# Patient Record
Sex: Female | Born: 1935 | Race: White | Hispanic: No | Marital: Married | Smoking: Never smoker
Health system: Southern US, Community
[De-identification: ages and names within clinical notes are randomized; demographics above are authoritative.]

## PROBLEM LIST (undated history)

## (undated) DIAGNOSIS — I10 Essential (primary) hypertension: Secondary | ICD-10-CM

## (undated) DIAGNOSIS — R1900 Intra-abdominal and pelvic swelling, mass and lump, unspecified site: Secondary | ICD-10-CM

## (undated) DIAGNOSIS — N179 Acute kidney failure, unspecified: Secondary | ICD-10-CM

## (undated) DIAGNOSIS — E785 Hyperlipidemia, unspecified: Secondary | ICD-10-CM

## (undated) HISTORY — PX: NO PAST SURGERIES: SHX2092

---

## 2017-11-01 DIAGNOSIS — R1900 Intra-abdominal and pelvic swelling, mass and lump, unspecified site: Secondary | ICD-10-CM

## 2017-11-01 DIAGNOSIS — N179 Acute kidney failure, unspecified: Secondary | ICD-10-CM

## 2017-11-01 HISTORY — DX: Intra-abdominal and pelvic swelling, mass and lump, unspecified site: R19.00

## 2017-11-01 HISTORY — DX: Acute kidney failure, unspecified: N17.9

## 2017-11-18 ENCOUNTER — Encounter (HOSPITAL_COMMUNITY): Payer: Self-pay | Admitting: *Deleted

## 2017-11-18 ENCOUNTER — Inpatient Hospital Stay (HOSPITAL_COMMUNITY): Payer: Medicare Other

## 2017-11-18 ENCOUNTER — Inpatient Hospital Stay (HOSPITAL_COMMUNITY)
Admission: AD | Admit: 2017-11-18 | Discharge: 2017-12-02 | DRG: 682 | Disposition: E | Payer: Medicare Other | Source: Other Acute Inpatient Hospital | Attending: Internal Medicine | Admitting: Internal Medicine

## 2017-11-18 ENCOUNTER — Other Ambulatory Visit: Payer: Self-pay

## 2017-11-18 DIAGNOSIS — D638 Anemia in other chronic diseases classified elsewhere: Secondary | ICD-10-CM | POA: Diagnosis present

## 2017-11-18 DIAGNOSIS — K5900 Constipation, unspecified: Secondary | ICD-10-CM | POA: Diagnosis present

## 2017-11-18 DIAGNOSIS — R339 Retention of urine, unspecified: Secondary | ICD-10-CM | POA: Diagnosis not present

## 2017-11-18 DIAGNOSIS — R571 Hypovolemic shock: Secondary | ICD-10-CM | POA: Diagnosis present

## 2017-11-18 DIAGNOSIS — R609 Edema, unspecified: Secondary | ICD-10-CM

## 2017-11-18 DIAGNOSIS — E785 Hyperlipidemia, unspecified: Secondary | ICD-10-CM | POA: Diagnosis present

## 2017-11-18 DIAGNOSIS — C786 Secondary malignant neoplasm of retroperitoneum and peritoneum: Secondary | ICD-10-CM | POA: Diagnosis present

## 2017-11-18 DIAGNOSIS — R739 Hyperglycemia, unspecified: Secondary | ICD-10-CM | POA: Diagnosis present

## 2017-11-18 DIAGNOSIS — R0600 Dyspnea, unspecified: Secondary | ICD-10-CM

## 2017-11-18 DIAGNOSIS — R1310 Dysphagia, unspecified: Secondary | ICD-10-CM | POA: Diagnosis not present

## 2017-11-18 DIAGNOSIS — E871 Hypo-osmolality and hyponatremia: Secondary | ICD-10-CM | POA: Diagnosis present

## 2017-11-18 DIAGNOSIS — N179 Acute kidney failure, unspecified: Secondary | ICD-10-CM | POA: Diagnosis present

## 2017-11-18 DIAGNOSIS — T445X5A Adverse effect of predominantly beta-adrenoreceptor agonists, initial encounter: Secondary | ICD-10-CM | POA: Diagnosis present

## 2017-11-18 DIAGNOSIS — N141 Nephropathy induced by other drugs, medicaments and biological substances: Secondary | ICD-10-CM | POA: Diagnosis present

## 2017-11-18 DIAGNOSIS — E440 Moderate protein-calorie malnutrition: Secondary | ICD-10-CM | POA: Diagnosis present

## 2017-11-18 DIAGNOSIS — I10 Essential (primary) hypertension: Secondary | ICD-10-CM | POA: Diagnosis present

## 2017-11-18 DIAGNOSIS — Z66 Do not resuscitate: Secondary | ICD-10-CM | POA: Diagnosis present

## 2017-11-18 DIAGNOSIS — T39395A Adverse effect of other nonsteroidal anti-inflammatory drugs [NSAID], initial encounter: Secondary | ICD-10-CM | POA: Diagnosis present

## 2017-11-18 DIAGNOSIS — E875 Hyperkalemia: Secondary | ICD-10-CM | POA: Diagnosis present

## 2017-11-18 DIAGNOSIS — R180 Malignant ascites: Secondary | ICD-10-CM | POA: Diagnosis present

## 2017-11-18 DIAGNOSIS — Z515 Encounter for palliative care: Secondary | ICD-10-CM | POA: Diagnosis present

## 2017-11-18 DIAGNOSIS — E872 Acidosis: Secondary | ICD-10-CM | POA: Diagnosis present

## 2017-11-18 DIAGNOSIS — C569 Malignant neoplasm of unspecified ovary: Secondary | ICD-10-CM | POA: Diagnosis present

## 2017-11-18 DIAGNOSIS — R188 Other ascites: Secondary | ICD-10-CM

## 2017-11-18 DIAGNOSIS — I361 Nonrheumatic tricuspid (valve) insufficiency: Secondary | ICD-10-CM | POA: Diagnosis not present

## 2017-11-18 DIAGNOSIS — E8770 Fluid overload, unspecified: Secondary | ICD-10-CM | POA: Diagnosis not present

## 2017-11-18 DIAGNOSIS — J9601 Acute respiratory failure with hypoxia: Secondary | ICD-10-CM | POA: Diagnosis present

## 2017-11-18 DIAGNOSIS — I878 Other specified disorders of veins: Secondary | ICD-10-CM

## 2017-11-18 DIAGNOSIS — N170 Acute kidney failure with tubular necrosis: Secondary | ICD-10-CM | POA: Diagnosis not present

## 2017-11-18 DIAGNOSIS — R1900 Intra-abdominal and pelvic swelling, mass and lump, unspecified site: Secondary | ICD-10-CM

## 2017-11-18 DIAGNOSIS — R579 Shock, unspecified: Secondary | ICD-10-CM | POA: Diagnosis not present

## 2017-11-18 HISTORY — DX: Intra-abdominal and pelvic swelling, mass and lump, unspecified site: R19.00

## 2017-11-18 HISTORY — DX: Acute kidney failure, unspecified: N17.9

## 2017-11-18 HISTORY — DX: Essential (primary) hypertension: I10

## 2017-11-18 HISTORY — DX: Hyperlipidemia, unspecified: E78.5

## 2017-11-18 LAB — BASIC METABOLIC PANEL
Anion gap: 14 (ref 5–15)
BUN: 47 mg/dL — ABNORMAL HIGH (ref 8–23)
CO2: 17 mmol/L — ABNORMAL LOW (ref 22–32)
Calcium: 10.5 mg/dL — ABNORMAL HIGH (ref 8.9–10.3)
Chloride: 95 mmol/L — ABNORMAL LOW (ref 98–111)
Creatinine, Ser: 4.49 mg/dL — ABNORMAL HIGH (ref 0.44–1.00)
GFR calc Af Amer: 9 mL/min — ABNORMAL LOW (ref >60)
GFR calc non Af Amer: 8 mL/min — ABNORMAL LOW (ref >60)
Glucose, Bld: 116 mg/dL — ABNORMAL HIGH (ref 70–99)
Potassium: 5.3 mmol/L — ABNORMAL HIGH (ref 3.5–5.1)
Sodium: 126 mmol/L — ABNORMAL LOW (ref 135–145)

## 2017-11-18 LAB — COMPREHENSIVE METABOLIC PANEL
ALBUMIN: 2.6 g/dL — AB (ref 3.5–5.0)
ALK PHOS: 94 U/L (ref 38–126)
ALT: 10 U/L (ref 0–44)
AST: 14 U/L — AB (ref 15–41)
Anion gap: 15 (ref 5–15)
BILIRUBIN TOTAL: 0.8 mg/dL (ref 0.3–1.2)
BUN: 47 mg/dL — AB (ref 8–23)
CALCIUM: 10.5 mg/dL — AB (ref 8.9–10.3)
CO2: 15 mmol/L — AB (ref 22–32)
Chloride: 95 mmol/L — ABNORMAL LOW (ref 98–111)
Creatinine, Ser: 4.47 mg/dL — ABNORMAL HIGH (ref 0.44–1.00)
GFR calc Af Amer: 10 mL/min — ABNORMAL LOW (ref >60)
GFR calc non Af Amer: 8 mL/min — ABNORMAL LOW (ref >60)
Glucose, Bld: 97 mg/dL (ref 70–99)
Potassium: 5.1 mmol/L (ref 3.5–5.1)
SODIUM: 125 mmol/L — AB (ref 135–145)
TOTAL PROTEIN: 6.1 g/dL — AB (ref 6.5–8.1)

## 2017-11-18 LAB — URINALYSIS, ROUTINE W REFLEX MICROSCOPIC
BILIRUBIN URINE: NEGATIVE
Bacteria, UA: NONE SEEN
Glucose, UA: NEGATIVE mg/dL
KETONES UR: NEGATIVE mg/dL
Nitrite: NEGATIVE
PROTEIN: 100 mg/dL — AB
RBC / HPF: >50 RBC/hpf — ABNORMAL HIGH (ref 0–5)
Specific Gravity, Urine: 1.024 (ref 1.005–1.030)
WBC, UA: >50 WBC/hpf — ABNORMAL HIGH (ref 0–5)
pH: 5 (ref 5.0–8.0)

## 2017-11-18 LAB — CBC WITH DIFFERENTIAL/PLATELET
BASOS ABS: 0 10*3/uL (ref 0.0–0.1)
BASOS PCT: 0 %
EOS ABS: 0 10*3/uL (ref 0.0–0.7)
Eosinophils Relative: 0 %
HCT: 32.4 % — ABNORMAL LOW (ref 36.0–46.0)
HEMOGLOBIN: 10.8 g/dL — AB (ref 12.0–15.0)
LYMPHS ABS: 1.3 10*3/uL (ref 0.7–4.0)
LYMPHS PCT: 4 %
MCH: 31 pg (ref 26.0–34.0)
MCHC: 33.3 g/dL (ref 30.0–36.0)
MCV: 93.1 fL (ref 78.0–100.0)
MONOS PCT: 5 %
Monocytes Absolute: 1.6 10*3/uL — ABNORMAL HIGH (ref 0.1–1.0)
Neutro Abs: 28.9 10*3/uL — ABNORMAL HIGH (ref 1.7–7.7)
Neutrophils Relative %: 91 %
PLATELETS: 445 10*3/uL — AB (ref 150–400)
RBC: 3.48 MIL/uL — ABNORMAL LOW (ref 3.87–5.11)
RDW: 12.8 % (ref 11.5–15.5)
WBC: 31.8 10*3/uL — ABNORMAL HIGH (ref 4.0–10.5)

## 2017-11-18 LAB — GLUCOSE, CAPILLARY
GLUCOSE-CAPILLARY: 109 mg/dL — AB (ref 70–99)
Glucose-Capillary: 127 mg/dL — ABNORMAL HIGH (ref 70–99)
Glucose-Capillary: 103 mg/dL — ABNORMAL HIGH (ref 70–99)

## 2017-11-18 LAB — LACTIC ACID, PLASMA
LACTIC ACID, VENOUS: 2.4 mmol/L — AB (ref 0.5–1.9)
Lactic Acid, Venous: 2.4 mmol/L (ref 0.5–1.9)

## 2017-11-18 LAB — MAGNESIUM: MAGNESIUM: 1.7 mg/dL (ref 1.7–2.4)

## 2017-11-18 LAB — PROCALCITONIN: Procalcitonin: 0.85 ng/mL

## 2017-11-18 LAB — MRSA PCR SCREENING: MRSA by PCR: NEGATIVE

## 2017-11-18 MED ORDER — SODIUM CHLORIDE 0.9 % IV BOLUS
1000.0000 mL | Freq: Once | INTRAVENOUS | Status: AC
  Administered 2017-11-18: 1000 mL via INTRAVENOUS

## 2017-11-18 MED ORDER — ONDANSETRON HCL 4 MG/2ML IJ SOLN
4.0000 mg | Freq: Four times a day (QID) | INTRAMUSCULAR | Status: DC | PRN
  Administered 2017-11-20: 4 mg via INTRAVENOUS
  Filled 2017-11-18: qty 2

## 2017-11-18 MED ORDER — ORAL CARE MOUTH RINSE
15.0000 mL | Freq: Two times a day (BID) | OROMUCOSAL | Status: DC
  Administered 2017-11-18 – 2017-11-23 (×8): 15 mL via OROMUCOSAL

## 2017-11-18 MED ORDER — INSULIN ASPART 100 UNIT/ML ~~LOC~~ SOLN
1.0000 [IU] | SUBCUTANEOUS | Status: DC
  Administered 2017-11-19: 1 [IU] via SUBCUTANEOUS

## 2017-11-18 MED ORDER — PNEUMOCOCCAL VAC POLYVALENT 25 MCG/0.5ML IJ INJ
0.5000 mL | INJECTION | INTRAMUSCULAR | Status: DC
  Filled 2017-11-18: qty 0.5

## 2017-11-18 MED ORDER — SODIUM CHLORIDE 0.9 % IV SOLN
INTRAVENOUS | Status: DC
  Administered 2017-11-18 – 2017-11-19 (×3): via INTRAVENOUS
  Administered 2017-11-19: 150 mL/h via INTRAVENOUS
  Administered 2017-11-19: 14:00:00 via INTRAVENOUS

## 2017-11-18 MED ORDER — ACETAMINOPHEN 325 MG PO TABS
650.0000 mg | ORAL_TABLET | ORAL | Status: DC | PRN
  Administered 2017-11-18: 650 mg via ORAL
  Filled 2017-11-18: qty 2

## 2017-11-18 MED ORDER — SODIUM CHLORIDE 0.9 % IV BOLUS
500.0000 mL | Freq: Once | INTRAVENOUS | Status: AC
  Administered 2017-11-18: 500 mL via INTRAVENOUS

## 2017-11-18 MED ORDER — SODIUM CHLORIDE 0.9 % IV SOLN: 250.0000 mL | INTRAVENOUS | Status: DC | PRN

## 2017-11-18 MED ORDER — HEPARIN SODIUM (PORCINE) 5000 UNIT/ML IJ SOLN
5000.0000 [IU] | Freq: Three times a day (TID) | INTRAMUSCULAR | Status: AC
  Administered 2017-11-18 – 2017-11-21 (×10): 5000 [IU] via SUBCUTANEOUS
  Filled 2017-11-18 (×8): qty 1

## 2017-11-18 MED ORDER — SODIUM POLYSTYRENE SULFONATE 15 GM/60ML PO SUSP
30.0000 g | Freq: Once | ORAL | Status: AC
  Administered 2017-11-18: 30 g via ORAL
  Filled 2017-11-18: qty 120

## 2017-11-18 NOTE — Progress Notes (Signed)
CRITICAL VALUE ALERT  Critical Value:  Lactic acid 2.4  Date & Time Notied:  11/16/2017 @ 2050  Provider Notified: Lake Bells, D.  Orders Received/Actions taken: 500cc NS bolus, q4hr bmet

## 2017-11-18 NOTE — Progress Notes (Signed)
CRITICAL VALUE ALERT  Critical Value:  Lactic Acid 2.4  Date & Time Notied:  11/17/2017 1755  Provider Notified: Dr. Veleta Miners  Orders Received/Actions taken: No new orders

## 2017-11-18 NOTE — Progress Notes (Signed)
PCCM INTERVAL PROGRESS NOTE   Admission labs back.  BMP Latest Ref Rng & Units 11/22/2017  Glucose 70 - 99 mg/dL 97  BUN 8 - 23 mg/dL 47(H)  Creatinine 0.44 - 1.00 mg/dL 4.47(H)  Sodium 135 - 145 mmol/L 125(L)  Potassium 3.5 - 5.1 mmol/L 5.1  Chloride 98 - 111 mmol/L 95(L)  CO2 22 - 32 mmol/L 15(L)  Calcium 8.9 - 10.3 mg/dL 10.5(H)   CBC Latest Ref Rng & Units 11/30/2017  WBC 4.0 - 10.5 K/uL 31.8(H)  Hemoglobin 12.0 - 15.0 g/dL 10.8(L)  Hematocrit 36.0 - 46.0 % 32.4(L)  Platelets 150 - 400 K/uL 445(H)   Mag 1.7  Lactic acid 2.4  Hyponatremia -Increase NS infusion rate to 125 -Repeat BMP every 2 hours tonight to monitor sodium correction.  AKI: Likely due to hypovolemia and medications (ARB, HCTZ, NSAIDS)  -nephrology following  Large pelvic mass on CT - will consult GYN/Surgery in the AM.   Georgann Housekeeper, AGACNP-BC [facility] Pulmonology/Critical Care Pager [phone] or [phone]  11/17/2017 6:49 PM        Georgann Housekeeper, AGACNP-BC [place] Pulmonology/Critical Care Pager [phone] or [phone]  12/01/2017 6:42 PM

## 2017-11-18 NOTE — H&P (Addendum)
PULMONARY / CRITICAL CARE MEDICINE   Name: Diane Martin MRN: [id number] DOB: [birth year]    ADMISSION DATE:  11/29/2017 CONSULTATION DATE:  11/22/2017  REFERRING MD:  [facility]  CHIEF COMPLAINT:  Abdominal pain  HISTORY OF PRESENT ILLNESS:   Mrs. Diane Martin is a 84 y.o. Female with a PMH of HTN & hyperlipidemia who is transferred to [facility] on 11/13/2017.  She initially presented to her PCP approximately 4 days ago with c/o abdominal pain in which a CT Abdomen was ordered showing Pelvic mass.  She then presented to [facility] ED on 7/17 with similar complaints of abdominal pain, plus no urine output for approximately 24 hours.  She reported poor P.O. Intake for several days.  She denied any sick contacts, fever, chills, SOB, or chest pain.  Initial workup in the ED was concerning for AKI (Creatinine 4.3), Hyperkalemia (K 6.3), and repeat CT abdomen with 16 x 23 cm mass in the omentum consistent with peritoneal spread of tumor, small amount of ascites, diverticulosis of distal colon, no evidence of hydronephrosis, and solid organs unremarkable in appearance.  She was to be transferred to a hospital with GYN oncology availability.  However, while awaiting bed availability, she became hypotensive (SBP 70s) requiring fluid volume resuscitation.  She was transferred to [facility] 11/01/2017 for further management of shock, AKI, and pelvic mass.  PCCM is asked to admit the pt.  PAST MEDICAL HISTORY :  She  has a past medical history of Hyperlipidemia and Hypertension.  PAST SURGICAL HISTORY: She  has no past surgical history on file.  No Known Allergies  No current facility-administered medications on file prior to encounter.    Current Outpatient Medications on File Prior to Encounter  Medication Sig  . atenolol (TENORMIN) 25 MG tablet Take by mouth 2 (two) times daily.    FAMILY HISTORY:  Her family history is not on file.  SOCIAL  HISTORY: She  reports that she has never smoked. She has never used smokeless tobacco.  REVIEW OF SYSTEMS:   Positives in BOLD: Gen: Denies fever, chills, weight change, fatigue, night sweats HEENT: Denies blurred vision, double vision, hearing loss, tinnitus, sinus congestion, rhinorrhea, sore throat, neck stiffness, dysphagia PULM: Denies shortness of breath, cough, sputum production, hemoptysis, wheezing CV: Denies chest pain, edema, orthopnea, paroxysmal nocturnal dyspnea, palpitations GI: Denies +abdominal pain, nausea, vomiting, diarrhea, hematochezia, melena, constipation, change in bowel habits GU: Denies dysuria, hematuria, polyuria, oliguria, urethral discharge Endocrine: Denies hot or cold intolerance, polyuria, polyphagia or appetite change Derm: Denies rash, dry skin, scaling or peeling skin change Heme: Denies easy bruising, bleeding, bleeding gums Neuro: Denies headache, numbness, weakness, slurred speech, loss of memory or consciousness   SUBJECTIVE:  Reports abdominal pain and general malaise Denies N/V, and diarrhea Denies SOB, cough, chest pain On 2 L [place]  VITAL SIGNS: BP (!) 99/45   Pulse 62   Temp 98 F (36.7 C) (Oral)   Resp (!) 21   Ht 5\' 5"  (1.651 m)   Wt 174 lb 13.2 oz (79.3 kg)   SpO2 96%   BMI 29.09 kg/m   HEMODYNAMICS:    VENTILATOR SETTINGS:    INTAKE / OUTPUT: No intake/output data recorded.  PHYSICAL EXAMINATION: General:  Acutely ill appearing female, sitting in bed, on 2L [place], in no acute distress Neuro:  Awake, alert and oriented, follows commands, no focal deficits HEENT:  Atraumatic, normocephalic, no JVD, neck supple Cardiovascular:  RRR, s1s2, no M/R/G  Lungs:  Clear throughout bilaterally, no wheezing, even, non-labored, symmetrical expansion, Abdomen:  Taut, tender, hypoactive BS Musculoskeletal:  No deformities, 2+ bilateral LE edema Skin:  Dry. No obvious rashes, lesions, or ulcerations   LABS:  BMET No results for  input(s): NA, K, CL, CO2, BUN, CREATININE, GLUCOSE in the last 168 hours.  Electrolytes No results for input(s): CALCIUM, MG, PHOS in the last 168 hours.  CBC No results for input(s): WBC, HGB, HCT, PLT in the last 168 hours.  Coag's No results for input(s): APTT, INR in the last 168 hours.  Sepsis Markers No results for input(s): LATICACIDVEN, PROCALCITON, O2SATVEN in the last 168 hours.  ABG No results for input(s): PHART, PCO2ART, PO2ART in the last 168 hours.  Liver Enzymes No results for input(s): AST, ALT, ALKPHOS, BILITOT, ALBUMIN in the last 168 hours.  Cardiac Enzymes No results for input(s): TROPONINI, PROBNP in the last 168 hours.  Glucose Recent Labs  Lab 11/29/2017 1328  GLUCAP 127*    Imaging No results found.   STUDIES:  CT Abdomen 7/18 (performed at [facility])>> large heterogenous mass with multiple cystic components arising from the pelvis.  The mass measures 16 x 24 cm.  Adnormal density in the omentum is consistent with peritoneal spread of tumor.  Small amount of ascites.  No evidence of abdominal or retroperitoneal adenopathy.  The solid organs are unremarkable in appearance.  Diverticulosis of distal colon.  No evidence of bowel obstruction. Repeat CT Abdomen 7/18>> ECHO 7/18>>  [place] Bilateral LE 7/18>>   CULTURES: Urine 7/18>> Blood x2  7/18>> Sputum 7/18>>  ANTIBIOTICS:   SIGNIFICANT EVENTS: 7/18  LINES/TUBES:  DISCUSSION: 84 y.o. Female transferred from [facility] with abdominal pain.  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  Pt also with shock (suspected hypovolemic +/- septic) and AKI.  ASSESSMENT / PLAN:  PULMONARY A: No active issues P:   Supplemental O2 prn to maintain O2 sats >92%  CARDIOVASCULAR A:  Shock (likely hypovolemic +/- septic) -Leukocytosis P:  ICU monitoring Maintain MAP >65 IV fluid bolus NS @ 75 ml/hr Obtain ECHO Obtain  US LE   RENAL A:   AKI Hyperkalemia Hypercalcemia P:  Nephrology consulted, appreciate input  (will call Nephrology once repeat Labs back) Monitor I&O's / urinary output Obtain STAT CMP Trend BMP Ensure adequate renal perfusion Avoid nephrotoxic agents as able Replace electrolytes as indicated Will give one time dose Kayexalate  GASTROINTESTINAL A:   Large pelvic mass (identified on CT from [place]) P:   NPO Obtain CT Abdomen/Pelvis Will need GYN consult once more stable and CT resulted back   HEMATOLOGIC A:   Anemia without signs of bleeding P:  Monitor for s/sx of bleeding Trend CBC Heparin SQ for VTE prophylaxis Transfuse for Hgb<7  INFECTIOUS A:   Leukocytosis Meets SIRS criteria, no obvious infectious source P:   Monitor fever curve Trend WBC's Trend PCT & Lactic Pan culture Will hold off on ABX for now given no obvious signs of infection   ENDOCRINE A:   Hyperglycemia P:   CBG's SSI Follow ICU hypo/hyperglycemia protocol  NEUROLOGIC A:   No active issues P:   Provide supportive care  Avoid sedating meds as able Lights on during the day   FAMILY  - Updates: Updated pt and husband at bedside 11/03/2017.  - Inter-disciplinary family meet or Palliative Care meeting due by:  11/25/17   Darel Hong, AGACNP-BC [facility]  [facility] Medicine Pager: [phone]  11/05/2017, 1:42 PM

## 2017-11-18 NOTE — Progress Notes (Signed)
[facility] Progress Note Patient Name: Niger Lavalle DOB: May 06, 1931 MRN: [id number]   Date of Service  11/07/2017  HPI/Events of Note  BP still soft  eICU Interventions  Repeat saline bolus Follow BMET's > saline as ordered late afternoon     Intervention Category Major Interventions: Shock - evaluation and management  Simonne Maffucci 11/06/2017, 9:06 PM

## 2017-11-18 NOTE — Consult Note (Signed)
[facility] Renal Consultation Note  Requesting MD: Mannam Indication for Consultation: AKI  HPI:  Diane Martin is a 84 y.o. female with past medical hist history really only of hypertension on an ARB and hyperlipidemia and may be some chronic lower extremity edema.  She lives in [place], she presented to [facility] yesterday with complaints of abdominal pain that have been long-standing but worsened of late and also with weakness.  She had been taking Aleve fairly frequently for this pain.  According to notes from [facility], creatinine had been 1.6 patient thinks that she did take Aleve 4 days ago, then yesterday 4.3 with an elevated potassium of 6.1.  These labs have not yet been repeated.  Patient had continue to take her Aleve and losartan.  The timeline is a little fuzzy but she does note a decrease in her urine output may be a day before presenting to the hospital.  Work-up in the hospital consisted of an abdominal CT which showed a large mass with omental extension.  No hydronephrosis was noted.  They attempted to find a hospital that had GYN malignancy capability.  However, prior to that her blood pressure dropped and they felt that a transfer was needed more acutely.  Therefore, she was sent here to the ICU.  Blood pressure is soft she is currently receiving fluid.  Foley catheter in place with around 100 cc of dark appearing urine.  Slight edema.  Other than abdominal pain and weakness she is without complaint and oriented    No results found for: CREATININE   PMHx:   Past Medical History:  Diagnosis Date  . Hyperlipidemia   . Hypertension     History reviewed. No pertinent surgical history.  Family Hx: No family history on file.  Social History:  reports that she has never smoked. She has never used smokeless tobacco. Her alcohol and drug histories are not on file.  Allergies: No Known Allergies  Medications: Prior to Admission medications    Medication Sig Start Date End Date Taking? Authorizing Provider  atenolol (TENORMIN) 25 MG tablet Take by mouth 2 (two) times daily.   Yes [provider]  co-enzyme Q-10 30 MG capsule Take 30 mg by mouth daily.   Yes [provider]  losartan-hydrochlorothiazide (HYZAAR) 50-12.5 MG tablet Take 1 tablet by mouth daily.   Yes [provider]  naproxen sodium (ALEVE) 220 MG tablet Take 220 mg by mouth as needed (pain).   Yes [provider]  ondansetron (ZOFRAN) 4 MG tablet Take 4 mg by mouth every 8 (eight) hours as needed for nausea or vomiting.   Yes [provider]  oxyCODONE-acetaminophen (PERCOCET) 7.5-325 MG tablet Take 1 tablet by mouth every 6 (six) hours as needed for severe pain.   Yes [provider]  pravastatin (PRAVACHOL) 10 MG tablet Take 10 mg by mouth daily.   Yes [provider]  triamcinolone cream (KENALOG) 0.1 % Apply 1 application topically as needed for rash. 10/28/17  Yes [provider]    I have reviewed the patient's current medications.  Labs:  Results for orders placed or performed during the hospital encounter of 11/08/2017 (from the past 48 hour(s))  MRSA PCR Screening     Status: None   Collection Time: 12/01/2017 12:58 PM  Result Value Ref Range   MRSA by PCR NEGATIVE NEGATIVE    Comment:        The GeneXpert MRSA Assay (FDA approved for NASAL specimens only),  is one component of a comprehensive MRSA colonization surveillance program. It is not intended to diagnose MRSA infection nor to guide or monitor treatment for MRSA infections. Performed at [facility], [place] [address], [place], [place] [address]   Glucose, capillary     Status: Abnormal   Collection Time: 12/01/2017  1:28 PM  Result Value Ref Range   Glucose-Capillary 127 (H) 70 - 99 mg/dL   Comment 1 Capillary Specimen    Comment 2 Notify RN   Urinalysis, Routine w reflex microscopic     Status: Abnormal   Collection  Time: 11/01/2017  3:00 PM  Result Value Ref Range   Color, Urine AMBER (A) YELLOW    Comment: BIOCHEMICALS MAY BE AFFECTED BY COLOR   APPearance CLOUDY (A) CLEAR   Specific Gravity, Urine 1.024 1.005 - 1.030   pH 5.0 5.0 - 8.0   Glucose, UA NEGATIVE NEGATIVE mg/dL   Hgb urine dipstick LARGE (A) NEGATIVE   Bilirubin Urine NEGATIVE NEGATIVE   Ketones, ur NEGATIVE NEGATIVE mg/dL   Protein, ur 100 (A) NEGATIVE mg/dL   Nitrite NEGATIVE NEGATIVE   Leukocytes, UA MODERATE (A) NEGATIVE   RBC / HPF >50 (H) 0 - 5 RBC/hpf   WBC, UA >50 (H) 0 - 5 WBC/hpf   Bacteria, UA NONE SEEN NONE SEEN    Comment: Performed at [facility], [address], [place], [place] [address]  Glucose, capillary     Status: Abnormal   Collection Time: 11/16/2017  3:23 PM  Result Value Ref Range   Glucose-Capillary 109 (H) 70 - 99 mg/dL   Comment 1 Capillary Specimen    Comment 2 Notify RN      ROS:  A comprehensive review of systems was negative except for: Constitutional: positive for fatigue Gastrointestinal: positive for abdominal pain Genitourinary: positive for decreased stream  Physical Exam: Vitals:   11/14/2017 1500 11/11/2017 1523  BP: (!) 108/51   Pulse: 67   Resp: 20   Temp:  97.9 F (36.6 C)  SpO2: 100%      General: Woman who does not look at 84 years of age.  She is alert and oriented-currently getting a lower extremity Doppler exam HEENT: Pupils equally round reactive to light, extraocular motions are intact, mucous membranes are moist Neck: No JVD  Heart: Regular rate and rhythm Lungs: Decreased breath sounds at the bases with poor effort Abdomen: Distended, tender Extremities: 1+ pitting edema bilaterally-prominent venous markings Skin: Warm and dry Neuro: Alert and oriented Foley- around 100 cc's of dark urine  Assessment/Plan: 84 year old white female with acute kidney injury in the setting of NSAIDs, hypotension on ARB and a new diagnosis of a pelvic mass with omental  extension 1.Renal-acute kidney injury.  Creatinine on 7/15 was 1.38-then on July 18 creatinine 4.3 with potassium 6.1,  urinalysis showing 5-10 white blood cells per high-power field 10-20 RBCs per high-power field but negative for protein-albumin 3.7.  Patient with excessive NSAID use as well as an ARB in the setting of hypotension.  I hope that these are the main culprits as far as her acute kidney injury.  To hold offending medications and follow.  Initial labs here are pending.  She is making a little bit of urine I hope we will be able to  alleviate this acute kidney injury and she will not require anything drastic like dialysis 2. Hypertension/volume  -hypotension which has improved with fluid administration.  Was on blood pressure medication at home and  took this fairly recently so will take some time to get out of her system 3.  New large pelvic mass with omental extension-according to the to the chart patient's mother died of ovarian cancer-further evaluation regarding this mass and treatment ongoing 4.  Hyperkalemia-unclear if this has been treated as of yet.  There is mention of Kayexalate enema but does not seem it was given - repeat labs pending 5. ANemia- hgb at outside hosp 10.9   Joeseph Verville A 12/01/2017, 4:23 PM

## 2017-11-18 NOTE — Progress Notes (Signed)
Preliminary notes--Bilateral lower extremities venous duplex exam completed. Negative for DVT.  Hongying Landry Mellow (RDMS RVT) 11/20/2017 4:41 PM

## 2017-11-19 ENCOUNTER — Encounter (HOSPITAL_COMMUNITY): Payer: Self-pay | Admitting: General Practice

## 2017-11-19 ENCOUNTER — Inpatient Hospital Stay (HOSPITAL_COMMUNITY): Payer: Medicare Other

## 2017-11-19 ENCOUNTER — Other Ambulatory Visit: Payer: Self-pay

## 2017-11-19 DIAGNOSIS — I361 Nonrheumatic tricuspid (valve) insufficiency: Secondary | ICD-10-CM

## 2017-11-19 LAB — CBC
HCT: 29.4 % — ABNORMAL LOW (ref 36.0–46.0)
Hemoglobin: 9.7 g/dL — ABNORMAL LOW (ref 12.0–15.0)
MCH: 30.7 pg (ref 26.0–34.0)
MCHC: 33 g/dL (ref 30.0–36.0)
MCV: 93 fL (ref 78.0–100.0)
Platelets: 416 10*3/uL — ABNORMAL HIGH (ref 150–400)
RBC: 3.16 MIL/uL — AB (ref 3.87–5.11)
RDW: 13 % (ref 11.5–15.5)
WBC: 28.9 10*3/uL — ABNORMAL HIGH (ref 4.0–10.5)

## 2017-11-19 LAB — GLUCOSE, CAPILLARY
GLUCOSE-CAPILLARY: 100 mg/dL — AB (ref 70–99)
GLUCOSE-CAPILLARY: 110 mg/dL — AB (ref 70–99)
GLUCOSE-CAPILLARY: 113 mg/dL — AB (ref 70–99)
Glucose-Capillary: 93 mg/dL (ref 70–99)
Glucose-Capillary: 138 mg/dL — ABNORMAL HIGH (ref 70–99)
Glucose-Capillary: 145 mg/dL — ABNORMAL HIGH (ref 70–99)

## 2017-11-19 LAB — BASIC METABOLIC PANEL
ANION GAP: 13 (ref 5–15)
ANION GAP: 13 (ref 5–15)
ANION GAP: 14 (ref 5–15)
Anion gap: 13 (ref 5–15)
Anion gap: 13 (ref 5–15)
Anion gap: 14 (ref 5–15)
BUN: 47 mg/dL — ABNORMAL HIGH (ref 8–23)
BUN: 47 mg/dL — ABNORMAL HIGH (ref 8–23)
BUN: 47 mg/dL — ABNORMAL HIGH (ref 8–23)
BUN: 48 mg/dL — ABNORMAL HIGH (ref 8–23)
BUN: 44 mg/dL — ABNORMAL HIGH (ref 8–23)
BUN: 46 mg/dL — ABNORMAL HIGH (ref 8–23)
CALCIUM: 9.8 mg/dL (ref 8.9–10.3)
CHLORIDE: 99 mmol/L (ref 98–111)
CO2: 16 mmol/L — AB (ref 22–32)
CO2: 16 mmol/L — ABNORMAL LOW (ref 22–32)
CO2: 17 mmol/L — ABNORMAL LOW (ref 22–32)
CO2: 14 mmol/L — ABNORMAL LOW (ref 22–32)
CO2: 16 mmol/L — ABNORMAL LOW (ref 22–32)
CO2: 15 mmol/L — ABNORMAL LOW (ref 22–32)
CREATININE: 3.39 mg/dL — AB (ref 0.44–1.00)
CREATININE: 3.2 mg/dL — AB (ref 0.44–1.00)
Calcium: 10.3 mg/dL (ref 8.9–10.3)
Calcium: 9.9 mg/dL (ref 8.9–10.3)
Calcium: 10.1 mg/dL (ref 8.9–10.3)
Calcium: 10 mg/dL (ref 8.9–10.3)
Calcium: 10 mg/dL (ref 8.9–10.3)
Chloride: 100 mmol/L (ref 98–111)
Chloride: 99 mmol/L (ref 98–111)
Chloride: 99 mmol/L (ref 98–111)
Chloride: 104 mmol/L (ref 98–111)
Chloride: 100 mmol/L (ref 98–111)
Creatinine, Ser: 4.29 mg/dL — ABNORMAL HIGH (ref 0.44–1.00)
Creatinine, Ser: 4.17 mg/dL — ABNORMAL HIGH (ref 0.44–1.00)
Creatinine, Ser: 4.07 mg/dL — ABNORMAL HIGH (ref 0.44–1.00)
Creatinine, Ser: 3.72 mg/dL — ABNORMAL HIGH (ref 0.44–1.00)
GFR calc Af Amer: 12 mL/min — ABNORMAL LOW (ref >60)
GFR, EST AFRICAN AMERICAN: 10 mL/min — AB (ref >60)
GFR, EST AFRICAN AMERICAN: 10 mL/min — AB (ref >60)
GFR, EST AFRICAN AMERICAN: 11 mL/min — AB (ref >60)
GFR, EST AFRICAN AMERICAN: 13 mL/min — AB (ref >60)
GFR, EST AFRICAN AMERICAN: 14 mL/min — AB (ref >60)
GFR, EST NON AFRICAN AMERICAN: 9 mL/min — AB (ref >60)
GFR, EST NON AFRICAN AMERICAN: 9 mL/min — AB (ref >60)
GFR, EST NON AFRICAN AMERICAN: 9 mL/min — AB (ref >60)
GFR, EST NON AFRICAN AMERICAN: 10 mL/min — AB (ref >60)
GFR, EST NON AFRICAN AMERICAN: 11 mL/min — AB (ref >60)
GFR, EST NON AFRICAN AMERICAN: 12 mL/min — AB (ref >60)
GLUCOSE: 113 mg/dL — AB (ref 70–99)
GLUCOSE: 108 mg/dL — AB (ref 70–99)
GLUCOSE: 114 mg/dL — AB (ref 70–99)
Glucose, Bld: 131 mg/dL — ABNORMAL HIGH (ref 70–99)
Glucose, Bld: 137 mg/dL — ABNORMAL HIGH (ref 70–99)
Glucose, Bld: 128 mg/dL — ABNORMAL HIGH (ref 70–99)
POTASSIUM: 4.8 mmol/L (ref 3.5–5.1)
POTASSIUM: 4.5 mmol/L (ref 3.5–5.1)
POTASSIUM: 5 mmol/L (ref 3.5–5.1)
POTASSIUM: 4.8 mmol/L (ref 3.5–5.1)
POTASSIUM: 4.7 mmol/L (ref 3.5–5.1)
Potassium: 5.1 mmol/L (ref 3.5–5.1)
SODIUM: 129 mmol/L — AB (ref 135–145)
SODIUM: 132 mmol/L — AB (ref 135–145)
SODIUM: 128 mmol/L — AB (ref 135–145)
Sodium: 129 mmol/L — ABNORMAL LOW (ref 135–145)
Sodium: 128 mmol/L — ABNORMAL LOW (ref 135–145)
Sodium: 129 mmol/L — ABNORMAL LOW (ref 135–145)

## 2017-11-19 LAB — PROCALCITONIN: PROCALCITONIN: 0.99 ng/mL

## 2017-11-19 LAB — URINE CULTURE: Culture: NO GROWTH

## 2017-11-19 LAB — ECHOCARDIOGRAM COMPLETE
HEIGHTINCHES: 65 in
Weight: 2818.36 oz

## 2017-11-19 MED ORDER — SENNOSIDES-DOCUSATE SODIUM 8.6-50 MG PO TABS
1.0000 | ORAL_TABLET | Freq: Every day | ORAL | Status: DC
  Administered 2017-11-19: 1 via ORAL
  Filled 2017-11-19: qty 1

## 2017-11-19 MED ORDER — SODIUM CHLORIDE 0.9 % IV BOLUS
1000.0000 mL | Freq: Once | INTRAVENOUS | Status: AC
  Administered 2017-11-19: 1000 mL via INTRAVENOUS

## 2017-11-19 MED ORDER — SODIUM POLYSTYRENE SULFONATE PO POWD
15.0000 g | Freq: Once | ORAL | Status: AC
  Administered 2017-11-19: 15 g via ORAL
  Filled 2017-11-19: qty 15

## 2017-11-19 MED ORDER — STERILE WATER FOR INJECTION IV SOLN
INTRAVENOUS | Status: DC
  Administered 2017-11-19 – 2017-11-21 (×4): via INTRAVENOUS
  Filled 2017-11-19 (×6): qty 850

## 2017-11-19 MED ORDER — ENSURE ENLIVE PO LIQD
237.0000 mL | Freq: Two times a day (BID) | ORAL | Status: DC
  Administered 2017-11-20 – 2017-11-22 (×4): 237 mL via ORAL

## 2017-11-19 MED ORDER — POLYETHYLENE GLYCOL 3350 17 G PO PACK
17.0000 g | PACK | Freq: Every day | ORAL | Status: DC
  Administered 2017-11-19 – 2017-11-22 (×4): 17 g via ORAL
  Filled 2017-11-19 (×4): qty 1

## 2017-11-19 MED ORDER — SODIUM POLYSTYRENE SULFONATE PO POWD: 15.0000 g | Freq: Once | ORAL | Status: DC

## 2017-11-19 NOTE — Progress Notes (Signed)
  Echocardiogram 2D Echocardiogram has been performed.  Diane Martin 11/19/2017, 10:43 AM

## 2017-11-19 NOTE — Progress Notes (Signed)
[facility] ROUNDING NOTE   Subjective:   Interval History: The patient was lying in her bed. She denied pain and stated that she slept as well as could be expected the prior day. She denied acute concerns.   Objective:  Vital signs in last 24 hours:  Temp:  [97.3 F (36.3 C)-98 F (36.7 C)] 97.8 F (36.6 C) (07/19 1113) Pulse Rate:  [52-84] 72 (07/19 1000) Resp:  [15-28] 25 (07/19 1100) BP: (87-111)/(39-72) 88/56 (07/19 1100) SpO2:  [91 %-100 %] 96 % (07/19 1000) Weight:  [174 lb 13.2 oz (79.3 kg)-176 lb 2.4 oz (79.9 kg)] 176 lb 2.4 oz (79.9 kg) (07/19 0500)  Weight change:  Filed Weights   11/05/2017 1243 11/19/17 0500  Weight: 174 lb 13.2 oz (79.3 kg) 176 lb 2.4 oz (79.9 kg)    Intake/Output: I/O last 3 completed shifts: In: 3344.4 [P.O.:30; I.V.:1915.4; IV Piggyback:1399] Out: 425 [Urine:425]   Intake/Output this shift:  Total I/O In: 591 [I.V.:591] Out: -   Physical Exam: General: A/O x4, in no acute distress, Hypotensive 95'K systolic, afebrile, nondiaphoretic CVS- RRR, no murmurs rubs or gallops RS- CTA bilaterally but in the prone position  ABD- BS present soft non-distended, nontender EXT- no edema bilaterally   Basic Metabolic Panel: Recent Labs  Lab 11/25/2017 1611 11/04/2017 1900 11/19/17 0120 11/19/17 0447 11/19/17 0828  NA 125* 126* 129* 128* 129*  K 5.1 5.3* 5.1 4.8 4.5  CL 95* 95* 100 99 99  CO2 15* 17* 16* 16* 17*  GLUCOSE 97 116* 113* 108* 114*  BUN 47* 47* 47* 47* 47*  CREATININE 4.47* 4.49* 4.29* 4.17* 4.07*  CALCIUM 10.5* 10.5* 10.3 9.9 10.1  MG 1.7  --   --   --   --     Liver Function Tests: Recent Labs  Lab 11/19/2017 1611  AST 14*  ALT 10  ALKPHOS 94  BILITOT 0.8  PROT 6.1*  ALBUMIN 2.6*   No results for input(s): LIPASE, AMYLASE in the last 168 hours. No results for input(s): AMMONIA in the last 168 hours.  CBC: Recent Labs  Lab 11/03/2017 1611 11/19/17 0447  WBC 31.8* 28.9*  NEUTROABS 28.9*  --   HGB  10.8* 9.7*  HCT 32.4* 29.4*  MCV 93.1 93.0  PLT 445* 416*    Cardiac Enzymes: No results for input(s): CKTOTAL, CKMB, CKMBINDEX, TROPONINI in the last 168 hours.  BNP: Invalid input(s): POCBNP  CBG: Recent Labs  Lab 11/29/2017 2025 11/14/2017 2356 11/19/17 0349 11/19/17 0727 11/19/17 1112  GLUCAP 103* 113* 100* 35 110*    Microbiology: Results for orders placed or performed during the hospital encounter of 11/05/2017  MRSA PCR Screening     Status: None   Collection Time: 11/15/2017 12:58 PM  Result Value Ref Range Status   MRSA by PCR NEGATIVE NEGATIVE Final    Comment:        The GeneXpert MRSA Assay (FDA approved for NASAL specimens only), is one component of a comprehensive MRSA colonization surveillance program. It is not intended to diagnose MRSA infection nor to guide or monitor treatment for MRSA infections. Performed at [facility], [place] [address], [place], [place] [address]   Culture, Urine     Status: None   Collection Time: 11/30/2017  3:00 PM  Result Value Ref Range Status   Specimen Description URINE, RANDOM  Final   Special Requests NONE  Final   Culture   Final    NO GROWTH Performed at [facility], [place] [address], [place], [place] [address]    Report Status 11/19/2017 FINAL  Final  Culture, blood (routine x 2)     Status: None (Preliminary result)   Collection Time: 11/17/2017  4:00 PM  Result Value Ref Range Status   Specimen Description BLOOD RIGHT ANTECUBITAL  Final   Special Requests   Final    BOTTLES DRAWN AEROBIC ONLY Blood Culture results may not be optimal due to an inadequate volume of blood received in culture bottles   Culture   Final    NO GROWTH < 24 HOURS Performed at [facility], [place] [address] [place], [place] [address]    Report Status PENDING  Incomplete  Culture, blood (routine x 2)     Status: None (Preliminary result)   Collection Time: 11/22/2017  4:30 PM  Result Value Ref Range Status   Specimen  Description BLOOD LEFT HAND  Final   Special Requests   Final    BOTTLES DRAWN AEROBIC ONLY Blood Culture results may not be optimal due to an inadequate volume of blood received in culture bottles   Culture   Final    NO GROWTH < 24 HOURS Performed at [facility], [place] [address] [place], [place] [address]    Report Status PENDING  Incomplete    Coagulation Studies: No results for input(s): LABPROT, INR in the last 72 hours.  Urinalysis: Recent Labs    11/01/2017 1500  COLORURINE AMBER*  LABSPEC 1.024  PHURINE 5.0  GLUCOSEU NEGATIVE  HGBUR LARGE*  BILIRUBINUR NEGATIVE  KETONESUR NEGATIVE  PROTEINUR 100*  NITRITE NEGATIVE  LEUKOCYTESUR MODERATE*      Imaging: Ct Abdomen Pelvis Wo Contrast  Result Date: 11/25/2017 CLINICAL DATA:  Pelvic mass EXAM: CT ABDOMEN AND PELVIS WITHOUT CONTRAST TECHNIQUE: Multidetector CT imaging of the abdomen and pelvis was performed following the standard protocol without IV contrast. COMPARISON:  None. FINDINGS: Lower chest: There is mild atelectasis in both lower lobes and in the right middle lobe. Moderate-sized type 3 hiatal hernia with contrast medium both in the hiatal hernia and in the distal esophagus suggesting dysmotility or reflux. 2.9 by 1.3 cm region soft tissue density adjacent to the stomach in the chest on image 14/3, possibly from adenopathy or tumor. There is mild nodularity in the lateral breast measuring about 1.7 by 1.4 cm on image 9/3, nonspecific. Coronary atherosclerosis noted. Aortic valve calcification is present. Hepatobiliary: Layering density in the gallbladder potentially from vicarious excretion of contrast or concentrated sludge. No definite biliary dilatation although the extrahepatic biliary tree is indistinct due to surrounding a densities. Pancreas: Unremarkable Spleen: Unremarkable Adrenals/Urinary Tract: Contrast medium was not administered on today's exam, but there is corticomedullary contrast in the kidneys  along contrast in the collecting systems of both kidneys. By report the patient did have a recent contrast enhanced CT exam in this in [place], [place], and the residual renal parenchymal contrast is indicative of acute renal injury and renal dysfunction. Bilateral mild renal atrophy. A Foley catheter is present in the urinary bladder. No current hydronephrosis. Portions the ureters are effaced in the vicinity of the large abdominopelvic mass. Stomach/Bowel: Displacement of bowel by the large abdominopelvic mass. Sigmoid colon diverticulosis without active diverticulitis. Orally administered contrast makes its way through to the rectum. The appendix appears normal and trace over the upper right side of the abdominopelvic mass. Vascular/Lymphatic: Aortoiliac atherosclerotic vascular disease. There scattered nodularity in the mesentery without well-defined individual lymph nodes.  Reproductive: Inseparable from the uterus and ovaries, there is a 25.7 by 15.7 by 24.5 cm (volume = 5180 cm^3) heterogeneous mass filling much of the pelvis and extending up into the abdomen well below the level of the umbilicus. The mass is mildly eccentric to the right side in its upper portion but fairly midline in the lower portion and effaces the urinary bladder there are potentially small punctate calcifications along the anterior inferior margin of the mass for example on images 74-75 of series 3. The ovaries and uterus are not separately individually identifiable. Other: Perihepatic and perisplenic ascites along with ascites tracking along the paracolic gutters. Diffuse infiltration of the omentum with reticular and faintly nodular pattern. Similar reticular and nodular infiltration of the mesentery. Along the right lower abdominal wall on image 81/3 there is a small amount of subcutaneous stranding and gas. Correlate with any recent injection along the lower abdominal wall. Musculoskeletal: Lumbar spondylosis and degenerative  disc disease with grade 1 degenerative anterior subluxation at L3-4 and L4-5, and suspected impingement at L3-4 on the right at L4-5. No findings osseous metastatic disease. IMPRESSION: 1. Large pelvic mass obscuring the uterus/ovaries and extending into the upper abdomen, size estimated at 5180 cubic cm, heterogeneous in appearance, with associated ascites and infiltration and nodularity of the omentum and mesentery. 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 Mucinous gastrointestinal tumors can also have peritoneal spread, but the association of the lesion with the pelvic organs favors ovarian origin. The large mass displaces surrounding structures such as the bowel and ureters. No hydronephrosis currently. 2. Persistent nephrogram related to the patient's outside CT scan from [facility]. Persistent contrast medium in the renal parenchyma indicates prominent renal dysfunction. 3. Nodularity in the left lateral breast, nonspecific and possibly simply due to glandular tissue. Correlate with mammographic history. 4. Moderate-sized type 3 hiatal hernia with contrast in the hernia and distal esophagus suggesting dysmotility or reflux. 5. 2.9 by 1.3 cm area of soft tissue density nodularity adjacent to the herniated stomach in the lower chest, concerning for possible tumor extension along the hiatal hernia. 6. Other imaging findings of potential clinical significance: Mild bibasilar atelectasis. Aortic Atherosclerosis (ICD10-I70.0). Coronary atherosclerosis with aortic valve calcification. Sigmoid colon diverticulosis. Lumbar spondylosis and degenerative disc disease with potential impingement at L3-4 and L4-5. Electronically Signed   By: Van Clines M.D.   On: 11/24/2017 17:47     Medications:   . sodium chloride    . sodium chloride 150 mL/hr at 11/19/17 1100  . sodium chloride 1,000 mL (11/19/17 1125)   . heparin  5,000 Units Subcutaneous Q8H  . insulin aspart  1-3  Units Subcutaneous Q4H  . mouth rinse  15 mL Mouth Rinse BID  . pneumococcal 23 valent vaccine  0.5 mL Intramuscular Tomorrow-1000  . polyethylene glycol  17 g Oral Daily  . senna-docusate  1 tablet Oral Daily   sodium chloride, acetaminophen, ondansetron (ZOFRAN) IV  Assessment/ Plan:  Assessment:  Diane Martin is a 84 yo F w/ a PMHx notable for HTN on an ARB and HLD who presented to [facility] on 07/17 w/ concern for chronic abdominal pain that acutely worsened of late with associated weakness. The patient attested to consuming high amounts of Aleve for several days for this pain. Prior Cr appears to have been 1.6 at [facility] but up to 4.3 today with a potassium of 6.1 down to 4.5 by day two. CT Abd demonstrated a large mass w/ omental extension but  without hydronephrosis. She was transferred to [facility] due to decreased Hgb for a higher level of care in an ICU.   Plan:  Acute renal injury-Cr. 1.38 on 7/15. Up to 4.3 on 07/18. UA demonstrated 5-10 WBC's, 10-20 RBC's per high-power field.  I agee that the NSAID use in addition to the ARB with event transient hypotension/dehydration can lead to renal injury. As such I agree with holding the ARB, discontinuing NSAIDS and gentle rehydration.     Improving today to 4.07 from a high here of 4.49  ANEMIA-Hgb 9.7, stable over 24 hours. Normocytic anemia. Monitor   HTN/VOL-Patient hypotensive. Hold antihypertensives, agree with fluid rehydration   Hyperkalemia-Resolved. Monitor with daily BMPs  Hyponatremia-Caution raising sodium greater than 35mmol/L per 24 hours   LOS: 1 Kathi Ludwig, MD Internal Medicine PGY-2

## 2017-11-19 NOTE — Progress Notes (Signed)
Discussed patient's abdominal mass likely d/t ovarian cancer with GYN.  The gyn-onc in [place], Dr. Denman George, is currently out of town, so they recommended that she be transferred to [facility] for further diagnosis.  I reviewed patient's CT scan with patient, her niece, and her husband and advised patient that this mass may be ovarian cancer, but it cannot be definitively diagnosed without surgical exploration.  In order for diagnosis and possible treatment, she was advised that she would need to be transferred.  She understands that if this is advanced ovarian cancer, her treatment options may be limited.  She and her family elected to be transferred to [facility] once she is medically cleared.  Patient needs further improvement in her kidney function and blood pressure prior to transfer, so we will hold off on transfer to [facility] for now. However, she no longer needs critical care, so we will transfer to the floor.     [place] Number: [phone]  Alcario Drought. Shan Levans, MD PGY-2, [facility] 11/19/2017 11:55 AM

## 2017-11-19 NOTE — Progress Notes (Addendum)
PULMONARY / CRITICAL CARE MEDICINE   Name: Diane Martin MRN: [id number] DOB: 1932-03-30    ADMISSION DATE:  11/12/2017 CONSULTATION DATE:  11/12/2017  REFERRING MD:  [facility]  CHIEF COMPLAINT:  Abdominal pain  HISTORY OF PRESENT ILLNESS:   Diane Martin is a 84 y.o. Female with a PMH of HTN & hyperlipidemia who is transferred to [facility] from [facility] on 11/06/2017.  She initially presented to her PCP approximately 4 days ago with c/o abdominal pain in which a CT Abdomen was ordered showing Pelvic mass.  She then presented to [facility] ED on 7/17 with similar complaints of abdominal pain, plus no urine output for approximately 24 hours.  She reported poor P.O. Intake for several days.  She denied any sick contacts, fever, chills, SOB, or chest pain.  Initial workup in the ED was concerning for AKI (Creatinine 4.3), Hyperkalemia (K 6.3), and repeat CT abdomen with 16 x 23 cm mass in the omentum consistent with peritoneal spread of tumor, small amount of ascites, diverticulosis of distal colon, no evidence of hydronephrosis, and solid organs unremarkable in appearance.  She was to be transferred to a hospital with GYN oncology availability.  However, while awaiting bed availability, she became hypotensive (SBP 70s) requiring fluid volume resuscitation.  She was transferred to [facility] 11/27/2017 for further management of shock, AKI, and pelvic mass.  PCCM is asked to admit the pt.  PAST MEDICAL HISTORY :  She  has a past medical history of Hyperlipidemia and Hypertension.  PAST SURGICAL HISTORY: She  has no past surgical history on file.  No Known Allergies  No current facility-administered medications on file prior to encounter.    Current Outpatient Medications on File Prior to Encounter  Medication Sig  . atenolol (TENORMIN) 25 MG tablet Take by mouth 2 (two) times daily.  Marland Kitchen co-enzyme Q-10 30 MG capsule Take 30 mg by mouth daily.  Marland Kitchen  losartan-hydrochlorothiazide (HYZAAR) 50-12.5 MG tablet Take 1 tablet by mouth daily.  . naproxen sodium (ALEVE) 220 MG tablet Take 220 mg by mouth as needed (pain).  . ondansetron (ZOFRAN) 4 MG tablet Take 4 mg by mouth every 8 (eight) hours as needed for nausea or vomiting.  Marland Kitchen oxyCODONE-acetaminophen (PERCOCET) 7.5-325 MG tablet Take 1 tablet by mouth every 6 (six) hours as needed for severe pain.  . pravastatin (PRAVACHOL) 10 MG tablet Take 10 mg by mouth daily.  Marland Kitchen triamcinolone cream (KENALOG) 0.1 % Apply 1 application topically as needed for rash.    FAMILY HISTORY:  Her family history is not on file.  SOCIAL HISTORY: She  reports that she has never smoked. She has never used smokeless tobacco.   SUBJECTIVE:  Patient reports that she is feeling okay today, but she continues to have some abdominal pain.  Would like to to get out of bed and have a diet.  VITAL SIGNS: BP (!) 87/39   Pulse (!) 58   Temp (!) 97.3 F (36.3 C) (Oral)   Resp 17   Ht _0  (1.651 m)   Wt 176 lb 2.4 oz (79.9 kg)   SpO2 100%   BMI 29.31 kg/m   HEMODYNAMICS:    VENTILATOR SETTINGS:    INTAKE / OUTPUT: I/O last 3 completed shifts: In: 3194.5 [P.O.:30; I.V.:1765.5; IV Piggyback:1399] Out: 425 [Urine:425]  PHYSICAL EXAMINATION: General:  Elderly appearing female, sitting in bed, on 2L , NAD Neuro:  Awake, alert and oriented x 2, follows commands, no focal  deficits HEENT:  Atraumatic, normocephalic, no JVD, neck supple Cardiovascular:  RRR, s1s2, no M/R/G Lungs:  CTAB, no wheezing or rhonchi, even, non-labored Abdomen:  Firm, nontender, hypoactive BS Musculoskeletal:  No deformities, 2+ bilateral LE edema Skin:  Dry. No obvious rashes, lesions, or ulcerations   LABS:  BMET Recent Labs  Lab 11/26/2017 1900 11/19/17 0120 11/19/17 0447  NA 126* 129* 128*  K 5.3* 5.1 4.8  CL 95* 100 99  CO2 17* 16* 16*  BUN 47* 47* 47*  CREATININE 4.49* 4.29* 4.17*  GLUCOSE 116* 113* 108*     Electrolytes Recent Labs  Lab 11/12/2017 1611 11/09/2017 1900 11/19/17 0120 11/19/17 0447  CALCIUM 10.5* 10.5* 10.3 9.9  MG 1.7  --   --   --     CBC Recent Labs  Lab 11/05/2017 1611 11/19/17 0447  WBC 31.8* 28.9*  HGB 10.8* 9.7*  HCT 32.4* 29.4*  PLT 445* 416*    Coag's No results for input(s): APTT, INR in the last 168 hours.  Sepsis Markers Recent Labs  Lab 11/24/2017 1602 11/07/2017 1611 11/19/2017 1845 11/19/17 0447  LATICACIDVEN 2.4*  --  2.4*  --   PROCALCITON  --  0.85  --  0.99    ABG No results for input(s): PHART, PCO2ART, PO2ART in the last 168 hours.  Liver Enzymes Recent Labs  Lab 11/14/2017 1611  AST 14*  ALT 10  ALKPHOS 94  BILITOT 0.8  ALBUMIN 2.6*    Cardiac Enzymes No results for input(s): TROPONINI, PROBNP in the last 168 hours.  Glucose Recent Labs  Lab 11/25/2017 1328 11/25/2017 1523 11/08/2017 2025 11/05/2017 2356 11/19/17 0349 11/19/17 0727  GLUCAP 127* 109* 103* 113* 100* 93    Imaging Ct Abdomen Pelvis Wo Contrast  Result Date: 11/04/2017 CLINICAL DATA:  Pelvic mass EXAM: CT ABDOMEN AND PELVIS WITHOUT CONTRAST TECHNIQUE: Multidetector CT imaging of the abdomen and pelvis was performed following the standard protocol without IV contrast. COMPARISON:  None. FINDINGS: Lower chest: There is mild atelectasis in both lower lobes and in the right middle lobe. Moderate-sized type 3 hiatal hernia with contrast medium both in the hiatal hernia and in the distal esophagus suggesting dysmotility or reflux. 2.9 by 1.3 cm region soft tissue density adjacent to the stomach in the chest on image 14/3, possibly from adenopathy or tumor. There is mild nodularity in the lateral breast measuring about 1.7 by 1.4 cm on image 9/3, nonspecific. Coronary atherosclerosis noted. Aortic valve calcification is present. Hepatobiliary: Layering density in the gallbladder potentially from vicarious excretion of contrast or concentrated sludge. No definite biliary  dilatation although the extrahepatic biliary tree is indistinct due to surrounding a densities. Pancreas: Unremarkable Spleen: Unremarkable Adrenals/Urinary Tract: Contrast medium was not administered on today's exam, but there is corticomedullary contrast in the kidneys along contrast in the collecting systems of both kidneys. By report the patient did have a recent contrast enhanced CT exam in this in [place], [place], and the residual renal parenchymal contrast is indicative of acute renal injury and renal dysfunction. Bilateral mild renal atrophy. A Foley catheter is present in the urinary bladder. No current hydronephrosis. Portions the ureters are effaced in the vicinity of the large abdominopelvic mass. Stomach/Bowel: Displacement of bowel by the large abdominopelvic mass. Sigmoid colon diverticulosis without active diverticulitis. Orally administered contrast makes its way through to the rectum. The appendix appears normal and trace over the upper right side of the abdominopelvic mass. Vascular/Lymphatic: Aortoiliac atherosclerotic vascular disease. There scattered nodularity in  the mesentery without well-defined individual lymph nodes. Reproductive: Inseparable from the uterus and ovaries, there is a 25.7 by 15.7 by 24.5 cm (volume = 5180 cm^3) heterogeneous mass filling much of the pelvis and extending up into the abdomen well below the level of the umbilicus. The mass is mildly eccentric to the right side in its upper portion but fairly midline in the lower portion and effaces the urinary bladder there are potentially small punctate calcifications along the anterior inferior margin of the mass for example on images 74-75 of series 3. The ovaries and uterus are not separately individually identifiable. Other: Perihepatic and perisplenic ascites along with ascites tracking along the paracolic gutters. Diffuse infiltration of the omentum with reticular and faintly nodular pattern. Similar reticular and  nodular infiltration of the mesentery. Along the right lower abdominal wall on image 81/3 there is a small amount of subcutaneous stranding and gas. Correlate with any recent injection along the lower abdominal wall. Musculoskeletal: Lumbar spondylosis and degenerative disc disease with grade 1 degenerative anterior subluxation at L3-4 and L4-5, and suspected impingement at L3-4 on the right at L4-5. No findings osseous metastatic disease. IMPRESSION: 1. Large pelvic mass obscuring the uterus/ovaries and extending into the upper abdomen, size estimated at 5180 cubic cm, heterogeneous in appearance, with associated ascites and infiltration and nodularity of the omentum and mesentery. 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 Mucinous gastrointestinal tumors can also have peritoneal spread, but the association of the lesion with the pelvic organs favors ovarian origin. The large mass displaces surrounding structures such as the bowel and ureters. No hydronephrosis currently. 2. Persistent nephrogram related to the patient's outside CT scan from [facility]. Persistent contrast medium in the renal parenchyma indicates prominent renal dysfunction. 3. Nodularity in the left lateral breast, nonspecific and possibly simply due to glandular tissue. Correlate with mammographic history. 4. Moderate-sized type 3 hiatal hernia with contrast in the hernia and distal esophagus suggesting dysmotility or reflux. 5. 2.9 by 1.3 cm area of soft tissue density nodularity adjacent to the herniated stomach in the lower chest, concerning for possible tumor extension along the hiatal hernia. 6. Other imaging findings of potential clinical significance: Mild bibasilar atelectasis. Aortic Atherosclerosis (ICD10-I70.0). Coronary atherosclerosis with aortic valve calcification. Sigmoid colon diverticulosis. Lumbar spondylosis and degenerative disc disease with potential impingement at L3-4 and L4-5.  Electronically Signed   By: Van Clines M.D.   On: 11/24/2017 17:47     STUDIES:  CT Abdomen 7/18 (performed at [place])>> large heterogenous mass with multiple cystic components arising from the pelvis.  The mass measures 16 x 24 cm.  Adnormal density in the omentum is consistent with peritoneal spread of tumor.  Small amount of ascites.  No evidence of abdominal or retroperitoneal adenopathy.  The solid organs are unremarkable in appearance.  Diverticulosis of distal colon.  No evidence of bowel obstruction. Repeat CT Abdomen 7/18>> Large pelvic mass obscuring the uterus/ovaries and extending into the upper abdomen, size estimated at 5180 cubic cm, heterogeneous in appearance, with associated ascites and infiltration and nodularity of the omentum and mesentery. 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 Mucinous gastrointestinal tumors can also have peritoneal spread, but the association of the lesion witthe pelvic organs favors ovarian origin. The large mass displaces surrounding structures such as the bowel and ureters. No hydronephrosis currently. ECHO 7/18>>  [place] Bilateral LE 7/18>>   CULTURES: Urine 7/18>> pending Blood x2  7/18>> pending Sputum 7/18>> pending  ANTIBIOTICS:   SIGNIFICANT  EVENTS: 7/18: admitted, given fluid boluses and maintenance fluids for AKI, hypotension, and hyponatremia 7/19: Mildly hypotensive, asymptomatic  LINES/TUBES:  DISCUSSION: 84 y.o. Female transferred from [facility] with abdominal pain.  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  Pt also with shock (suspected hypovolemic +/- septic) and AKI.  ASSESSMENT / PLAN:  PULMONARY A: No active issues P:   Supplemental O2 prn to maintain O2 sats >92%  CARDIOVASCULAR A:  Shock (likely hypovolemic +/- septic).  Soft pressures overnight treated with fluids.  Current BP  87/39. -Leukocytosis P:  ICU monitoring Maintain MAP >65 NS _0  ml/hr Obtain ECHO   RENAL A:   AKI - Creatinine slightly improved on 7/19 to 4.17 from 4.49 on 7/18 Hypercalcemia P:  Nephrology consulted, appreciate input  Monitor I&O's / urinary output Trend BMP Q4H d/t hyperkalemia Ensure adequate renal perfusion Avoid nephrotoxic agents as able Replace electrolytes as indicated Give 1 L NS bolus Trial off of foley catheter  GASTROINTESTINAL A:   Large pelvic mass likely d/t ovarian cancer. Constipation. P:   Miralax and senna GYN consult for diagnosis and treatment options Renal diet  HEMATOLOGIC A:   Anemia without signs of bleeding P:  Monitor for s/sx of bleeding Trend CBC Heparin SQ for VTE prophylaxis   INFECTIOUS A:   Leukocytosis Met SIRS criteria on admission, no obvious infectious source. Reports some urinary symptoms, but not a reliable historian.  UA without nitrites. Lactic acid stable at 2.4 on 7/19 Procalcitonin slightly elevated at 0.85 and 0.99, indicating sepsis is possible P:   Monitor fever curve Trend WBC's Trend PCT & Lactic Follow up cultures Obtain CXR Will hold off on ABX for now given no obvious signs of infection; can add ceftriaxone if urine cx positive   ENDOCRINE A:   Hyperglycemia P:   CBG's SSI Follow ICU hypo/hyperglycemia protocol  NEUROLOGIC A:   No active issues P:   Provide supportive care  Avoid sedating meds as able Lights on during the day   FAMILY  - Updates: Updated pt and husband at bedside 11/19/17. - Inter-disciplinary family meet or Palliative Care meeting due by:  11/25/17  Alcario Drought. Shan Levans, MD PGY-2, [facility] 11/19/2017 8:29 AM   11/19/2017, 8:19 AM

## 2017-11-19 NOTE — Progress Notes (Signed)
Patient and family instructed to use call bell to ask for help if patient need bathroom bed alarm going off went into room niece was helping very unsteady patient to restroom. Patient was sat in chair for dinner, family called for help but before nurse or CNA could get to room family had gotten patient to bed. Reinforced with family for patients safety please call and wait for staff to assist with movement to and from bed or to bathroom. Family verbalized understanding.  Andrey Hoobler, Tivis Ringer, RN

## 2017-11-19 NOTE — Progress Notes (Signed)
Initial Nutrition Assessment  DOCUMENTATION CODES:   Non-severe (moderate) malnutrition in context of acute illness/injury  INTERVENTION:    Recommend liberalize diet to regular to help improve intake.   Ensure Enlive po BID, each supplement provides 350 kcal and 20 grams of protein  NUTRITION DIAGNOSIS:   Moderate Malnutrition related to acute illness as evidenced by mild fat depletion, mild muscle depletion.  GOAL:   Patient will meet greater than or equal to 90% of their needs  MONITOR:   PO intake, Supplement acceptance  REASON FOR ASSESSMENT:   Malnutrition Screening Tool    ASSESSMENT:   84 yo female with PMH of HTN, and HLD who was transferred to [facility] from [facility] on 7/18 with large abdominal/pelvic mass.   Patient reports usual weight 165 lbs and that she has lost ~15 lbs, but current weight is 176 lbs. She likes Premier Protein supplements; has a few cases at home. Agreed to try Ensure here in the hospital. Appetite and intake at home has been poor for the past 2-3 weeks.  Labs reviewed. Sodium 129 (L) CBG's: 100-93-110 Medications reviewed and include Novolog and Miralax. Plans for transfer to [facility] for diagnosis and treatment of abdominal mass. Ovarian cancer is suspected, but cannot be confirmed without surgery. The gyn-onc physician in [place] is currently out of town.    NUTRITION - FOCUSED PHYSICAL EXAM:    Most Recent Value  Orbital Region  Mild depletion  Upper Arm Region  Mild depletion  Thoracic and Lumbar Region  Unable to assess  Buccal Region  Mild depletion  Temple Region  Mild depletion  Clavicle Bone Region  Mild depletion  Clavicle and Acromion Bone Region  No depletion  Scapular Bone Region  No depletion  Dorsal Hand  Mild depletion  Patellar Region  No depletion  Anterior Thigh Region  No depletion  Posterior Calf Region  No depletion  Edema (RD Assessment)  Moderate  Hair  Reviewed  Eyes  Reviewed  Mouth  Reviewed   Skin  Reviewed  Nails  Reviewed       Diet Order:   Diet Order           Diet renal with fluid restriction Fluid restriction: 1200 mL Fluid; Room service appropriate? Yes; Fluid consistency: Thin  Diet effective now          EDUCATION NEEDS:   No education needs have been identified at this time  Skin:  Skin Assessment: Skin Integrity Issues: Skin Integrity Issues:: Other (Comment) Other: non pressure wounds to sacrum & buttocks  Last BM:  7/16  Height:   Ht Readings from Last 1 Encounters:  11/27/2017 5\' 5"  (1.651 m)    Weight:   Wt Readings from Last 1 Encounters:  11/19/17 176 lb 2.4 oz (79.9 kg)    Ideal Body Weight:  56.8 kg  BMI:  Body mass index is 29.31 kg/m.  Estimated Nutritional Needs:   Kcal:  1600-1800  Protein:  80-90 gm  Fluid:  1.6-1.8 L    Molli Barrows, RD, LDN, [place] Pager [phone] After Hours Pager [phone]

## 2017-11-20 DIAGNOSIS — R571 Hypovolemic shock: Secondary | ICD-10-CM

## 2017-11-20 DIAGNOSIS — R1900 Intra-abdominal and pelvic swelling, mass and lump, unspecified site: Secondary | ICD-10-CM

## 2017-11-20 DIAGNOSIS — E440 Moderate protein-calorie malnutrition: Secondary | ICD-10-CM

## 2017-11-20 LAB — RENAL FUNCTION PANEL
Albumin: 2.2 g/dL — ABNORMAL LOW (ref 3.5–5.0)
Anion gap: 15 (ref 5–15)
BUN: 50 mg/dL — ABNORMAL HIGH (ref 8–23)
CHLORIDE: 95 mmol/L — AB (ref 98–111)
CO2: 20 mmol/L — AB (ref 22–32)
Calcium: 10 mg/dL (ref 8.9–10.3)
Creatinine, Ser: 2.97 mg/dL — ABNORMAL HIGH (ref 0.44–1.00)
GFR calc non Af Amer: 13 mL/min — ABNORMAL LOW (ref >60)
GFR, EST AFRICAN AMERICAN: 16 mL/min — AB (ref >60)
Glucose, Bld: 163 mg/dL — ABNORMAL HIGH (ref 70–99)
POTASSIUM: 3.8 mmol/L (ref 3.5–5.1)
Phosphorus: 5.3 mg/dL — ABNORMAL HIGH (ref 2.5–4.6)
Sodium: 130 mmol/L — ABNORMAL LOW (ref 135–145)

## 2017-11-20 LAB — CBC
HCT: 32 % — ABNORMAL LOW (ref 36.0–46.0)
HEMOGLOBIN: 10.7 g/dL — AB (ref 12.0–15.0)
MCH: 30.7 pg (ref 26.0–34.0)
MCHC: 33.4 g/dL (ref 30.0–36.0)
MCV: 91.7 fL (ref 78.0–100.0)
Platelets: 466 10*3/uL — ABNORMAL HIGH (ref 150–400)
RBC: 3.49 MIL/uL — AB (ref 3.87–5.11)
RDW: 13.2 % (ref 11.5–15.5)
WBC: 33.9 10*3/uL — ABNORMAL HIGH (ref 4.0–10.5)

## 2017-11-20 MED ORDER — SENNOSIDES-DOCUSATE SODIUM 8.6-50 MG PO TABS
2.0000 | ORAL_TABLET | Freq: Two times a day (BID) | ORAL | Status: DC
  Administered 2017-11-20 – 2017-11-21 (×2): 2 via ORAL
  Administered 2017-11-22: 1 via ORAL
  Filled 2017-11-20 (×5): qty 2

## 2017-11-20 MED ORDER — TRAMADOL HCL 50 MG PO TABS
100.0000 mg | ORAL_TABLET | Freq: Four times a day (QID) | ORAL | Status: DC | PRN
  Administered 2017-11-20 – 2017-11-21 (×3): 100 mg via ORAL
  Filled 2017-11-20 (×3): qty 2

## 2017-11-20 MED ORDER — HYOSCYAMINE SULFATE 0.5 MG/ML IJ SOLN
0.5000 mg | Freq: Once | INTRAMUSCULAR | Status: AC
  Administered 2017-11-20: 0.5 mg via INTRAVENOUS
  Filled 2017-11-20: qty 1

## 2017-11-20 MED ORDER — TRAMADOL HCL 50 MG PO TABS: 100.0000 mg | ORAL_TABLET | Freq: Four times a day (QID) | ORAL | Status: DC | PRN

## 2017-11-20 MED ORDER — MORPHINE SULFATE (PF) 2 MG/ML IV SOLN
1.0000 mg | INTRAVENOUS | Status: DC | PRN
  Administered 2017-11-20: 2 mg via INTRAVENOUS
  Filled 2017-11-20: qty 1

## 2017-11-20 NOTE — Progress Notes (Signed)
PROGRESS NOTE    Diane Martin  XIP:[id number] DOB: [birth year] DOA: 11/15/2017 PCP: Default, Provider, MD    Brief Narrative:  84 year old with hypertension, hyperlipidemia admitted to hospital in [place] for abdominal pain, constipation, large pelvic mass, developed AKI with a creatinine of 4.3, hyperkalemia. CT abdomen and pelvis without contrast showed a pelvic mass suspicious for gyn malignancy with peritoneal metastasis.  She developed hypotension and admitted to [facility] service and transferred to ICU.   Assessment & Plan:   Active Problems:   Acute kidney injury ([place])   Pelvic mass   Shock ([place])   Acute renal failure (ARF) ([facility])   Malnutrition of moderate degree   Hypovolemic shock:  Initially admitted to ICU under PCCM service.  Did not require vasopressors.  On IV fluids for resuscitation.  BP parameters are still borderline.     AKI with metabolic acidosis.  Suspect secondary to a combination of hypovolemia, contrast induced nephropathy , NSAIDS, ARB use.  Improvement from 4.3 to 3.2 today.  Continue with gentle hydration and bicarb infusion.    Heterogenous ovarian mass with peritoneal carcinomatosis:  - ob gyn consulted, pt's family wanted to talk to [facility] GYN in house before initiating transfer to [facility].  - pain control with tramadol oral and IV morphine.  - senna colace and miralax for constipation.    Anemia of chronic disease:  Hemoglobin stable around 9.  No signs of acute blood loss.  Transfuse to keep hemoglobin greater than 7.     Hyperkalemia:  Resolved.    Hyponatremia:  ? Unclear etiology. Sodium at 129.    Leukocytosis with thrombocytosis : wbc count at 28.9.  Suspect reactive.  No signs of infection.   H/o Hypertension:  bp parameters borderline. Holding home bp medications.    Hyperlipidemia: on pravachol at home.      DVT prophylaxis: sq heparin.  Code Status: FULL CODE.  Family Communication: daughters at bedside.  Discussed the plan  Disposition Plan: pending clinical improvement.    Consultants:   Ob Gyn- Dr Glennon MacLaurance Flatten.   Nephrology   Procedures: none.   Antimicrobials: none.    Subjective: Reports feeling weak and lower abdominal pain.   Objective: Vitals:   11/19/17 1700 11/19/17 2133 11/20/17 0551 11/20/17 1008  BP: (!) 96/48 (!) 96/47 (!) 99/44 (!) 99/52  Pulse: 68 83 82 77  Resp: (!) 22 18 18 18   Temp: 98.1 F (36.7 C) 97.6 F (36.4 C) 98 F (36.7 C) (!) 97.5 F (36.4 C)  TempSrc: Oral Oral Oral Oral  SpO2: 96% 91%  92%  Weight:      Height:        Intake/Output Summary (Last 24 hours) at 11/20/2017 1055 Last data filed at 11/20/2017 0600 Gross per 24 hour  Intake 2579.98 ml  Output 300 ml  Net 2279.98 ml   Filed Weights   11/07/2017 1243 11/19/17 0500  Weight: 79.3 kg (174 lb 13.2 oz) 79.9 kg (176 lb 2.4 oz)    Examination:  General exam: chronically ill looking lady, not in acute distress.  Respiratory system: diminished at bases, no wheezing or rhonchi.  Cardiovascular system: S1 & S2 heard, RRR. No JVD,   Gastrointestinal system: Abdomen is nondistended, soft and nontender. No organomegaly or masses felt. Normal bowel sounds heard. Central nervous system: Alert and oriented. No focal neurological deficits. Extremities: leg edema 2+ Skin: lower extremities have multiple keratosis / dermatosis papules, non itchy.  Psychiatry:Mood & affect appropriate.  Data Reviewed: I have personally reviewed following labs and imaging studies  CBC: Recent Labs  Lab 11/14/2017 1611 11/19/17 0447  WBC 31.8* 28.9*  NEUTROABS 28.9*  --   HGB 10.8* 9.7*  HCT 32.4* 29.4*  MCV 93.1 93.0  PLT 445* 102*   Basic Metabolic Panel: Recent Labs  Lab 11/24/2017 1611  11/19/17 0447 11/19/17 0828 11/19/17 1257 11/19/17 1641 11/19/17 2043  NA 125*   < > 128* 129* 132* 128* 129*  K 5.1   < > 4.8 4.5 5.0 4.8 4.7  CL 95*   < > 99 99 104 99 100  CO2 15*   < > 16* 17* 14*  16* 15*  GLUCOSE 97   < > 108* 114* 131* 137* 128*  BUN 47*   < > 47* 47* 48* 44* 46*  CREATININE 4.47*   < > 4.17* 4.07* 3.72* 3.39* 3.20*  CALCIUM 10.5*   < > 9.9 10.1 9.8 10.0 10.0  MG 1.7  --   --   --   --   --   --    < > = values in this interval not displayed.   GFR: Estimated Creatinine Clearance: 13.7 mL/min (A) (by C-G formula based on SCr of 3.2 mg/dL (H)). Liver Function Tests: Recent Labs  Lab 11/01/2017 1611  AST 14*  ALT 10  ALKPHOS 94  BILITOT 0.8  PROT 6.1*  ALBUMIN 2.6*   No results for input(s): LIPASE, AMYLASE in the last 168 hours. No results for input(s): AMMONIA in the last 168 hours. Coagulation Profile: No results for input(s): INR, PROTIME in the last 168 hours. Cardiac Enzymes: No results for input(s): CKTOTAL, CKMB, CKMBINDEX, TROPONINI in the last 168 hours. BNP (last 3 results) No results for input(s): PROBNP in the last 8760 hours. HbA1C: No results for input(s): HGBA1C in the last 72 hours. CBG: Recent Labs  Lab 11/19/17 0349 11/19/17 0727 11/19/17 1112 11/19/17 1526 11/19/17 1648  GLUCAP 100* 93 110* 138* 145*   Lipid Profile: No results for input(s): CHOL, HDL, LDLCALC, TRIG, CHOLHDL, LDLDIRECT in the last 72 hours. Thyroid Function Tests: No results for input(s): TSH, T4TOTAL, FREET4, T3FREE, THYROIDAB in the last 72 hours. Anemia Panel: No results for input(s): VITAMINB12, FOLATE, FERRITIN, TIBC, IRON, RETICCTPCT in the last 72 hours. Sepsis Labs: Recent Labs  Lab 12/01/2017 1602 11/13/2017 1611 11/06/2017 1845 11/19/17 0447  PROCALCITON  --  0.85  --  0.99  LATICACIDVEN 2.4*  --  2.4*  --     Recent Results (from the past 240 hour(s))  MRSA PCR Screening     Status: None   Collection Time: 11/07/2017 12:58 PM  Result Value Ref Range Status   MRSA by PCR NEGATIVE NEGATIVE Final    Comment:        The GeneXpert MRSA Assay (FDA approved for NASAL specimens only), is one component of a comprehensive MRSA  colonization surveillance program. It is not intended to diagnose MRSA infection nor to guide or monitor treatment for MRSA infections. Performed at [facility], [place] [address], [place], [place] [address]   Culture, Urine     Status: None   Collection Time: 11/09/2017  3:00 PM  Result Value Ref Range Status   Specimen Description URINE, RANDOM  Final   Special Requests NONE  Final   Culture   Final    NO GROWTH Performed at [facility], [place] [address] [place], [place] [address]    Report Status  11/19/2017 FINAL  Final  Culture, blood (routine x 2)     Status: None (Preliminary result)   Collection Time: 12/01/2017  4:00 PM  Result Value Ref Range Status   Specimen Description BLOOD RIGHT ANTECUBITAL  Final   Special Requests   Final    BOTTLES DRAWN AEROBIC ONLY Blood Culture results may not be optimal due to an inadequate volume of blood received in culture bottles   Culture   Final    NO GROWTH < 24 HOURS Performed at [place] [address] [place], [place] [address]    Report Status PENDING  Incomplete  Culture, blood (routine x 2)     Status: None (Preliminary result)   Collection Time: 11/04/2017  4:30 PM  Result Value Ref Range Status   Specimen Description BLOOD LEFT HAND  Final   Special Requests   Final    BOTTLES DRAWN AEROBIC ONLY Blood Culture results may not be optimal due to an inadequate volume of blood received in culture bottles   Culture   Final    NO GROWTH < 24 HOURS Performed at [facility], [place] [address] [place], [place] [address]    Report Status PENDING  Incomplete         Radiology Studies: Ct Abdomen Pelvis Wo Contrast  Result Date: 11/11/2017 CLINICAL DATA:  Pelvic mass EXAM: CT ABDOMEN AND PELVIS WITHOUT CONTRAST TECHNIQUE: Multidetector CT imaging of the abdomen and pelvis was performed following the standard protocol without IV contrast. COMPARISON:  None. FINDINGS: Lower chest: There is mild atelectasis in  both lower lobes and in the right middle lobe. Moderate-sized type 3 hiatal hernia with contrast medium both in the hiatal hernia and in the distal esophagus suggesting dysmotility or reflux. 2.9 by 1.3 cm region soft tissue density adjacent to the stomach in the chest on image 14/3, possibly from adenopathy or tumor. There is mild nodularity in the lateral breast measuring about 1.7 by 1.4 cm on image 9/3, nonspecific. Coronary atherosclerosis noted. Aortic valve calcification is present. Hepatobiliary: Layering density in the gallbladder potentially from vicarious excretion of contrast or concentrated sludge. No definite biliary dilatation although the extrahepatic biliary tree is indistinct due to surrounding a densities. Pancreas: Unremarkable Spleen: Unremarkable Adrenals/Urinary Tract: Contrast medium was not administered on today's exam, but there is corticomedullary contrast in the kidneys along contrast in the collecting systems of both kidneys. By report the patient did have a recent contrast enhanced CT exam in this in [place], [place], and the residual renal parenchymal contrast is indicative of acute renal injury and renal dysfunction. Bilateral mild renal atrophy. A Foley catheter is present in the urinary bladder. No current hydronephrosis. Portions the ureters are effaced in the vicinity of the large abdominopelvic mass. Stomach/Bowel: Displacement of bowel by the large abdominopelvic mass. Sigmoid colon diverticulosis without active diverticulitis. Orally administered contrast makes its way through to the rectum. The appendix appears normal and trace over the upper right side of the abdominopelvic mass. Vascular/Lymphatic: Aortoiliac atherosclerotic vascular disease. There scattered nodularity in the mesentery without well-defined individual lymph nodes. Reproductive: Inseparable from the uterus and ovaries, there is a 25.7 by 15.7 by 24.5 cm (volume = 5180 cm^3) heterogeneous mass filling much  of the pelvis and extending up into the abdomen well below the level of the umbilicus. The mass is mildly eccentric to the right side in its upper portion but fairly midline in the lower portion and effaces the urinary bladder there  are potentially small punctate calcifications along the anterior inferior margin of the mass for example on images 74-75 of series 3. The ovaries and uterus are not separately individually identifiable. Other: Perihepatic and perisplenic ascites along with ascites tracking along the paracolic gutters. Diffuse infiltration of the omentum with reticular and faintly nodular pattern. Similar reticular and nodular infiltration of the mesentery. Along the right lower abdominal wall on image 81/3 there is a small amount of subcutaneous stranding and gas. Correlate with any recent injection along the lower abdominal wall. Musculoskeletal: Lumbar spondylosis and degenerative disc disease with grade 1 degenerative anterior subluxation at L3-4 and L4-5, and suspected impingement at L3-4 on the right at L4-5. No findings osseous metastatic disease. IMPRESSION: 1. Large pelvic mass obscuring the uterus/ovaries and extending into the upper abdomen, size estimated at 5180 cubic cm, heterogeneous in appearance, with associated ascites and infiltration and nodularity of the omentum and mesentery. 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 Mucinous gastrointestinal tumors can also have peritoneal spread, but the association of the lesion with the pelvic organs favors ovarian origin. The large mass displaces surrounding structures such as the bowel and ureters. No hydronephrosis currently. 2. Persistent nephrogram related to the patient's outside CT scan from [facility]. Persistent contrast medium in the renal parenchyma indicates prominent renal dysfunction. 3. Nodularity in the left lateral breast, nonspecific and possibly simply due to glandular tissue. Correlate with  mammographic history. 4. Moderate-sized type 3 hiatal hernia with contrast in the hernia and distal esophagus suggesting dysmotility or reflux. 5. 2.9 by 1.3 cm area of soft tissue density nodularity adjacent to the herniated stomach in the lower chest, concerning for possible tumor extension along the hiatal hernia. 6. Other imaging findings of potential clinical significance: Mild bibasilar atelectasis. Aortic Atherosclerosis (ICD10-I70.0). Coronary atherosclerosis with aortic valve calcification. Sigmoid colon diverticulosis. Lumbar spondylosis and degenerative disc disease with potential impingement at L3-4 and L4-5. Electronically Signed   By: Van Clines M.D.   On: 11/04/2017 17:47   Dg Chest Portable 1 View  Result Date: 11/19/2017 CLINICAL DATA:  Shock.  History ARF.  Hypertension. EXAM: PORTABLE CHEST 1 VIEW COMPARISON:  No recent prior. FINDINGS: Mediastinum and hilar structures normal. Heart size normal. Low lung volumes with bibasilar atelectasis. No pleural effusion or pneumothorax. No acute bony abnormality. IMPRESSION: Low lung volumes with bibasilar atelectasis. Electronically Signed   By: Marcello Moores  Register   On: 11/19/2017 12:06        Scheduled Meds: . feeding supplement (ENSURE ENLIVE)  237 mL Oral BID BM  . heparin  5,000 Units Subcutaneous Q8H  . mouth rinse  15 mL Mouth Rinse BID  . pneumococcal 23 valent vaccine  0.5 mL Intramuscular Tomorrow-1000  . polyethylene glycol  17 g Oral Daily  . senna-docusate  2 tablet Oral BID   Continuous Infusions: . sodium chloride    .  sodium bicarbonate (isotonic) infusion in sterile water 75 mL/hr at 11/20/17 0549     LOS: 2 days    Time spent: 35 minutes.     Hosie Poisson, MD [facility] Pager [phone]   If 7PM-7AM, please contact night-coverage [email or web address] Password [facility] 11/20/2017, 10:55 AM

## 2017-11-20 NOTE — Consult Note (Signed)
Reason for Consult: Newly diagnosed pelvic mass Referring Physician: Esau Grew, MD  Diane Martin is an 84 y.o. female P1 with admitted to a hospital in [place] for abdominal pain, constipation, large pelvic mass.  She was in ARF.  She had a CT abdomen which showed 23 cm mass concerning for malignancy, no evidence of hydronephrosis per report.  Developed hypotension requiring transferred to the ICU.  She was subsequently transferred to [facility] for further evaluation.  Repeat imaging showed a large pelvic mass with omental cake and small volume ascites.  A CXR was negative.  The patient endorses several months of abdominal pain, anorexia, nausea, rare vomiting, early satiety and pressure with urination.  Her abdomen has felt " firm".  She reports weight loss of 15 lbs over 6 months.   No family h/o cancer; her mother was diagnoses with a pelvic "tumor".  No prior colonoscopy.   HPI:   Past Medical History:  Diagnosis Date  . ARF (acute renal failure) ([facility]) 11/2017  . Hyperlipidemia   . Hyperlipidemia   . Hypertension   . Pelvic mass 11/2017    Past Surgical History:  Procedure Laterality Date  . NO PAST SURGERIES      POB/GYN hx:  NSVD x 1; remote h/o gynecologic care  History reviewed. No pertinent family history.  Social History:  reports that she has never smoked. She has never used smokeless tobacco. She reports that she drank alcohol. She reports that she does not use drugs.  Allergies: No Known Allergies  Medications: I have reviewed the patient's current medications.  Results for orders placed or performed during the hospital encounter of 11/04/2017 (from the past 48 hour(s))  Comprehensive metabolic panel     Status: Abnormal   Collection Time: 11/10/2017  4:11 PM  Result Value Ref Range   Sodium 125 (L) 135 - 145 mmol/L   Potassium 5.1 3.5 - 5.1 mmol/L   Chloride 95 (L) 98 - 111 mmol/L    Comment: Please note change in reference range.   CO2 15 (L) 22 - 32 mmol/L   Glucose, Bld 97 70 - 99 mg/dL    Comment: Please note change in reference range.   BUN 47 (H) 8 - 23 mg/dL    Comment: Please note change in reference range.   Creatinine, Ser 4.47 (H) 0.44 - 1.00 mg/dL   Calcium 10.5 (H) 8.9 - 10.3 mg/dL   Total Protein 6.1 (L) 6.5 - 8.1 g/dL   Albumin 2.6 (L) 3.5 - 5.0 g/dL   AST 14 (L) 15 - 41 U/L   ALT 10 0 - 44 U/L    Comment: Please note change in reference range.   Alkaline Phosphatase 94 38 - 126 U/L   Total Bilirubin 0.8 0.3 - 1.2 mg/dL   GFR calc non Af Amer 8 (L) >60 mL/min   GFR calc Af Amer 10 (L) >60 mL/min    Comment: (NOTE) The eGFR has been calculated using the CKD EPI equation. This calculation has not been validated in all clinical situations. eGFR's persistently <60 mL/min signify possible Chronic Kidney Disease.    Anion gap 15 5 - 15    Comment: Performed at [facility] [address], [place], [place] [address]  CBC with Differential/Platelet     Status: Abnormal   Collection Time: 11/09/2017  4:11 PM  Result Value Ref Range   WBC 31.8 (H) 4.0 - 10.5 K/uL   RBC 3.48 (L) 3.87 - 5.11 MIL/uL  Hemoglobin 10.8 (L) 12.0 - 15.0 g/dL   HCT 32.4 (L) 36.0 - 46.0 %   MCV 93.1 78.0 - 100.0 fL   MCH 31.0 26.0 - 34.0 pg   MCHC 33.3 30.0 - 36.0 g/dL   RDW 12.8 11.5 - 15.5 %   Platelets 445 (H) 150 - 400 K/uL   Neutrophils Relative % 91 %   Lymphocytes Relative 4 %   Monocytes Relative 5 %   Eosinophils Relative 0 %   Basophils Relative 0 %   Neutro Abs 28.9 (H) 1.7 - 7.7 K/uL   Lymphs Abs 1.3 0.7 - 4.0 K/uL   Monocytes Absolute 1.6 (H) 0.1 - 1.0 K/uL   Eosinophils Absolute 0.0 0.0 - 0.7 K/uL   Basophils Absolute 0.0 0.0 - 0.1 K/uL   WBC Morphology WHITE COUNT CONFIRMED ON SMEAR    Smear Review MORPHOLOGY UNREMARKABLE     Comment: Performed at [facility], [place] [address] [place], [place] [address]  Magnesium     Status: None   Collection Time: 12/01/2017  4:11 PM  Result Value Ref Range   Magnesium 1.7 1.7 - 2.4  mg/dL    Comment: Performed at [facility], [place] [address], [place], [place] [address]  Procalcitonin     Status: None   Collection Time: 11/02/2017  4:11 PM  Result Value Ref Range   Procalcitonin 0.85 ng/mL    Comment:        Interpretation: PCT > 0.5 ng/mL and <= 2 ng/mL: Systemic infection (sepsis) is possible, but other conditions are known to elevate PCT as well. (NOTE)       Sepsis PCT Algorithm           Lower Respiratory Tract                                      Infection PCT Algorithm    ----------------------------     ----------------------------         PCT < 0.25 ng/mL                PCT < 0.10 ng/mL         Strongly encourage             Strongly discourage   discontinuation of antibiotics    initiation of antibiotics    ----------------------------     -----------------------------       PCT 0.25 - 0.50 ng/mL            PCT 0.10 - 0.25 ng/mL               OR       >80% decrease in PCT            Discourage initiation of                                            antibiotics      Encourage discontinuation           of antibiotics    ----------------------------     -----------------------------         PCT >= 0.50 ng/mL              PCT 0.26 - 0.50 ng/mL  AND       <80% decrease in PCT             Encourage initiation of                                             antibiotics       Encourage continuation           of antibiotics    ----------------------------     -----------------------------        PCT >= 0.50 ng/mL                  PCT > 0.50 ng/mL               AND         increase in PCT                  Strongly encourage                                      initiation of antibiotics    Strongly encourage escalation           of antibiotics                                     -----------------------------                                           PCT <= 0.25 ng/mL                                                 OR                                         > 80% decrease in PCT                                     Discontinue / Do not initiate                                             antibiotics Performed at [facility], [place] [address], [place], [place] [address]   Culture, blood (routine x 2)     Status: None (Preliminary result)   Collection Time: 11/16/2017  4:30 PM  Result Value Ref Range   Specimen Description BLOOD LEFT HAND    Special Requests      BOTTLES DRAWN AEROBIC ONLY Blood Culture results may not be optimal due to an inadequate volume of blood received in culture bottles   Culture      NO GROWTH 2 DAYS Performed at [facility] [address],  [place], [place] [address]    Report Status PENDING   Lactic acid, plasma     Status: Abnormal   Collection Time: 11/12/2017  6:45 PM  Result Value Ref Range   Lactic Acid, Venous 2.4 (HH) 0.5 - 1.9 mmol/L    Comment: CRITICAL RESULT CALLED TO, READ BACK BY AND VERIFIED WITH: SHEPHERD B,RN 11/24/2017 2050 WAYK Performed at [facility], [place] [address], [place], [place] [address]   Basic metabolic panel     Status: Abnormal   Collection Time: 11/26/2017  7:00 PM  Result Value Ref Range   Sodium 126 (L) 135 - 145 mmol/L   Potassium 5.3 (H) 3.5 - 5.1 mmol/L   Chloride 95 (L) 98 - 111 mmol/L    Comment: Please note change in reference range.   CO2 17 (L) 22 - 32 mmol/L   Glucose, Bld 116 (H) 70 - 99 mg/dL    Comment: Please note change in reference range.   BUN 47 (H) 8 - 23 mg/dL    Comment: Please note change in reference range.   Creatinine, Ser 4.49 (H) 0.44 - 1.00 mg/dL   Calcium 10.5 (H) 8.9 - 10.3 mg/dL   GFR calc non Af Amer 8 (L) >60 mL/min   GFR calc Af Amer 9 (L) >60 mL/min    Comment: (NOTE) The eGFR has been calculated using the CKD EPI equation. This calculation has not been validated in all clinical situations. eGFR's persistently <60 mL/min signify possible Chronic Kidney Disease.    Anion gap 14 5 - 15    Comment:  Performed at [facility] [address], [place], [place] [address]  Glucose, capillary     Status: Abnormal   Collection Time: 11/14/2017  8:25 PM  Result Value Ref Range   Glucose-Capillary 103 (H) 70 - 99 mg/dL   Comment 1 Capillary Specimen   Glucose, capillary     Status: Abnormal   Collection Time: 11/13/2017 11:56 PM  Result Value Ref Range   Glucose-Capillary 113 (H) 70 - 99 mg/dL   Comment 1 Capillary Specimen    Comment 2 Notify RN   Basic metabolic panel     Status: Abnormal   Collection Time: 11/19/17  1:20 AM  Result Value Ref Range   Sodium 129 (L) 135 - 145 mmol/L   Potassium 5.1 3.5 - 5.1 mmol/L   Chloride 100 98 - 111 mmol/L    Comment: Please note change in reference range.   CO2 16 (L) 22 - 32 mmol/L   Glucose, Bld 113 (H) 70 - 99 mg/dL    Comment: Please note change in reference range.   BUN 47 (H) 8 - 23 mg/dL    Comment: Please note change in reference range.   Creatinine, Ser 4.29 (H) 0.44 - 1.00 mg/dL   Calcium 10.3 8.9 - 10.3 mg/dL   GFR calc non Af Amer 9 (L) >60 mL/min   GFR calc Af Amer 10 (L) >60 mL/min    Comment: (NOTE) The eGFR has been calculated using the CKD EPI equation. This calculation has not been validated in all clinical situations. eGFR's persistently <60 mL/min signify possible Chronic Kidney Disease.    Anion gap 13 5 - 15    Comment: Performed at [facility] [address] [place], [place] [address]  Glucose, capillary     Status: Abnormal   Collection Time: 11/19/17  3:49 AM  Result Value Ref Range   Glucose-Capillary 100 (H)  70 - 99 mg/dL   Comment 1 Capillary Specimen   CBC     Status: Abnormal   Collection Time: 11/19/17  4:47 AM  Result Value Ref Range   WBC 28.9 (H) 4.0 - 10.5 K/uL   RBC 3.16 (L) 3.87 - 5.11 MIL/uL   Hemoglobin 9.7 (L) 12.0 - 15.0 g/dL   HCT 29.4 (L) 36.0 - 46.0 %   MCV 93.0 78.0 - 100.0 fL   MCH 30.7 26.0 - 34.0 pg   MCHC 33.0 30.0 - 36.0 g/dL   RDW 13.0 11.5 - 15.5 %   Platelets 416  (H) 150 - 400 K/uL    Comment: Performed at [facility], [place] [address] [place], [place] [address]  Procalcitonin     Status: None   Collection Time: 11/19/17  4:47 AM  Result Value Ref Range   Procalcitonin 0.99 ng/mL    Comment:        Interpretation: PCT > 0.5 ng/mL and <= 2 ng/mL: Systemic infection (sepsis) is possible, but other conditions are known to elevate PCT as well. (NOTE)       Sepsis PCT Algorithm           Lower Respiratory Tract                                      Infection PCT Algorithm    ----------------------------     ----------------------------         PCT < 0.25 ng/mL                PCT < 0.10 ng/mL         Strongly encourage             Strongly discourage   discontinuation of antibiotics    initiation of antibiotics    ----------------------------     -----------------------------       PCT 0.25 - 0.50 ng/mL            PCT 0.10 - 0.25 ng/mL               OR       >80% decrease in PCT            Discourage initiation of                                            antibiotics      Encourage discontinuation           of antibiotics    ----------------------------     -----------------------------         PCT >= 0.50 ng/mL              PCT 0.26 - 0.50 ng/mL                AND       <80% decrease in PCT             Encourage initiation of                                             antibiotics       Encourage continuation  of antibiotics    ----------------------------     -----------------------------        PCT >= 0.50 ng/mL                  PCT > 0.50 ng/mL               AND         increase in PCT                  Strongly encourage                                      initiation of antibiotics    Strongly encourage escalation           of antibiotics                                     -----------------------------                                           PCT <= 0.25 ng/mL                                                 OR                                         > 80% decrease in PCT                                     Discontinue / Do not initiate                                             antibiotics Performed at [facility], [address], [place], [place] [address]   Basic metabolic panel     Status: Abnormal   Collection Time: 11/19/17  4:47 AM  Result Value Ref Range   Sodium 128 (L) 135 - 145 mmol/L   Potassium 4.8 3.5 - 5.1 mmol/L   Chloride 99 98 - 111 mmol/L    Comment: Please note change in reference range.   CO2 16 (L) 22 - 32 mmol/L   Glucose, Bld 108 (H) 70 - 99 mg/dL    Comment: Please note change in reference range.   BUN 47 (H) 8 - 23 mg/dL    Comment: Please note change in reference range.   Creatinine, Ser 4.17 (H) 0.44 - 1.00 mg/dL   Calcium 9.9 8.9 - 10.3 mg/dL   GFR calc non Af Amer 9 (L) >60 mL/min   GFR calc Af Amer 10 (L) >60 mL/min    Comment: (NOTE) The eGFR has been calculated using the CKD EPI equation. This calculation has not been validated in all clinical situations. eGFR's persistently <60 mL/min signify possible Chronic  Kidney Disease.    Anion gap 13 5 - 15    Comment: Performed at [facility] [address], [place], [place] [address]  Glucose, capillary     Status: None   Collection Time: 11/19/17  7:27 AM  Result Value Ref Range   Glucose-Capillary 93 70 - 99 mg/dL   Comment 1 Capillary Specimen    Comment 2 Notify RN   Basic metabolic panel     Status: Abnormal   Collection Time: 11/19/17  8:28 AM  Result Value Ref Range   Sodium 129 (L) 135 - 145 mmol/L   Potassium 4.5 3.5 - 5.1 mmol/L   Chloride 99 98 - 111 mmol/L    Comment: Please note change in reference range.   CO2 17 (L) 22 - 32 mmol/L   Glucose, Bld 114 (H) 70 - 99 mg/dL    Comment: Please note change in reference range.   BUN 47 (H) 8 - 23 mg/dL    Comment: Please note change in reference range.   Creatinine, Ser 4.07 (H) 0.44 - 1.00 mg/dL   Calcium 10.1 8.9 - 10.3 mg/dL   GFR  calc non Af Amer 9 (L) >60 mL/min   GFR calc Af Amer 11 (L) >60 mL/min    Comment: (NOTE) The eGFR has been calculated using the CKD EPI equation. This calculation has not been validated in all clinical situations. eGFR's persistently <60 mL/min signify possible Chronic Kidney Disease.    Anion gap 13 5 - 15    Comment: Performed at [facility] [address], [place], [place] [address]  Glucose, capillary     Status: Abnormal   Collection Time: 11/19/17 11:12 AM  Result Value Ref Range   Glucose-Capillary 110 (H) 70 - 99 mg/dL   Comment 1 Capillary Specimen    Comment 2 Notify RN   Basic metabolic panel     Status: Abnormal   Collection Time: 11/19/17 12:57 PM  Result Value Ref Range   Sodium 132 (L) 135 - 145 mmol/L   Potassium 5.0 3.5 - 5.1 mmol/L   Chloride 104 98 - 111 mmol/L    Comment: Please note change in reference range.   CO2 14 (L) 22 - 32 mmol/L   Glucose, Bld 131 (H) 70 - 99 mg/dL    Comment: Please note change in reference range.   BUN 48 (H) 8 - 23 mg/dL    Comment: Please note change in reference range.   Creatinine, Ser 3.72 (H) 0.44 - 1.00 mg/dL   Calcium 9.8 8.9 - 10.3 mg/dL   GFR calc non Af Amer 10 (L) >60 mL/min   GFR calc Af Amer 12 (L) >60 mL/min    Comment: (NOTE) The eGFR has been calculated using the CKD EPI equation. This calculation has not been validated in all clinical situations. eGFR's persistently <60 mL/min signify possible Chronic Kidney Disease.    Anion gap 14 5 - 15    Comment: Performed at [facility] [address] [place], [place] [address]  Glucose, capillary     Status: Abnormal   Collection Time: 11/19/17  3:26 PM  Result Value Ref Range   Glucose-Capillary 138 (H) 70 - 99 mg/dL   Comment 1 Capillary Specimen    Comment 2 Notify RN   Basic metabolic panel     Status: Abnormal   Collection Time: 11/19/17  4:41 PM  Result Value Ref Range   Sodium 128 (L) 135 -  145 mmol/L   Potassium 4.8 3.5 - 5.1 mmol/L    Chloride 99 98 - 111 mmol/L    Comment: Please note change in reference range.   CO2 16 (L) 22 - 32 mmol/L   Glucose, Bld 137 (H) 70 - 99 mg/dL    Comment: Please note change in reference range.   BUN 44 (H) 8 - 23 mg/dL    Comment: Please note change in reference range.   Creatinine, Ser 3.39 (H) 0.44 - 1.00 mg/dL   Calcium 10.0 8.9 - 10.3 mg/dL   GFR calc non Af Amer 11 (L) >60 mL/min   GFR calc Af Amer 13 (L) >60 mL/min    Comment: (NOTE) The eGFR has been calculated using the CKD EPI equation. This calculation has not been validated in all clinical situations. eGFR's persistently <60 mL/min signify possible Chronic Kidney Disease.    Anion gap 13 5 - 15    Comment: Performed at [facility] [address] [place], [place] [address]  Glucose, capillary     Status: Abnormal   Collection Time: 11/19/17  4:48 PM  Result Value Ref Range   Glucose-Capillary 145 (H) 70 - 99 mg/dL  Basic metabolic panel     Status: Abnormal   Collection Time: 11/19/17  8:43 PM  Result Value Ref Range   Sodium 129 (L) 135 - 145 mmol/L   Potassium 4.7 3.5 - 5.1 mmol/L   Chloride 100 98 - 111 mmol/L    Comment: Please note change in reference range.   CO2 15 (L) 22 - 32 mmol/L   Glucose, Bld 128 (H) 70 - 99 mg/dL    Comment: Please note change in reference range.   BUN 46 (H) 8 - 23 mg/dL    Comment: Please note change in reference range.   Creatinine, Ser 3.20 (H) 0.44 - 1.00 mg/dL   Calcium 10.0 8.9 - 10.3 mg/dL   GFR calc non Af Amer 12 (L) >60 mL/min   GFR calc Af Amer 14 (L) >60 mL/min    Comment: (NOTE) The eGFR has been calculated using the CKD EPI equation. This calculation has not been validated in all clinical situations. eGFR's persistently <60 mL/min signify possible Chronic Kidney Disease.    Anion gap 14 5 - 15    Comment: Performed at [facility] [address], [place], [place] [address]  Renal function panel     Status: Abnormal   Collection Time: 11/20/17  12:10 PM  Result Value Ref Range   Sodium 130 (L) 135 - 145 mmol/L   Potassium 3.8 3.5 - 5.1 mmol/L    Comment: DELTA CHECK NOTED   Chloride 95 (L) 98 - 111 mmol/L    Comment: Please note change in reference range.   CO2 20 (L) 22 - 32 mmol/L   Glucose, Bld 163 (H) 70 - 99 mg/dL    Comment: Please note change in reference range.   BUN 50 (H) 8 - 23 mg/dL    Comment: Please note change in reference range.   Creatinine, Ser 2.97 (H) 0.44 - 1.00 mg/dL   Calcium 10.0 8.9 - 10.3 mg/dL   Phosphorus 5.3 (H) 2.5 - 4.6 mg/dL   Albumin 2.2 (L) 3.5 - 5.0 g/dL   GFR calc non Af Amer 13 (L) >60 mL/min   GFR calc Af Amer 16 (L) >60 mL/min    Comment: (NOTE) The eGFR has been calculated using the CKD EPI equation. This calculation  has not been validated in all clinical situations. eGFR's persistently <60 mL/min signify possible Chronic Kidney Disease.    Anion gap 15 5 - 15    Comment: Performed at [facility] [address], [place], [place] [address]  CBC     Status: Abnormal   Collection Time: 11/20/17 12:10 PM  Result Value Ref Range   WBC 33.9 (H) 4.0 - 10.5 K/uL    Comment: REPEATED TO VERIFY   RBC 3.49 (L) 3.87 - 5.11 MIL/uL   Hemoglobin 10.7 (L) 12.0 - 15.0 g/dL   HCT 32.0 (L) 36.0 - 46.0 %   MCV 91.7 78.0 - 100.0 fL   MCH 30.7 26.0 - 34.0 pg   MCHC 33.4 30.0 - 36.0 g/dL   RDW 13.2 11.5 - 15.5 %   Platelets 466 (H) 150 - 400 K/uL    Comment: Performed at [facility], [place] [address], [place], [place] [address]    Ct Abdomen Pelvis Wo Contrast  Result Date: 11/16/2017 CLINICAL DATA:  Pelvic mass EXAM: CT ABDOMEN AND PELVIS WITHOUT CONTRAST TECHNIQUE: Multidetector CT imaging of the abdomen and pelvis was performed following the standard protocol without IV contrast. COMPARISON:  None. FINDINGS: Lower chest: There is mild atelectasis in both lower lobes and in the right middle lobe. Moderate-sized type 3 hiatal hernia with contrast medium both in the hiatal hernia and  in the distal esophagus suggesting dysmotility or reflux. 2.9 by 1.3 cm region soft tissue density adjacent to the stomach in the chest on image 14/3, possibly from adenopathy or tumor. There is mild nodularity in the lateral breast measuring about 1.7 by 1.4 cm on image 9/3, nonspecific. Coronary atherosclerosis noted. Aortic valve calcification is present. Hepatobiliary: Layering density in the gallbladder potentially from vicarious excretion of contrast or concentrated sludge. No definite biliary dilatation although the extrahepatic biliary tree is indistinct due to surrounding a densities. Pancreas: Unremarkable Spleen: Unremarkable Adrenals/Urinary Tract: Contrast medium was not administered on today's exam, but there is corticomedullary contrast in the kidneys along contrast in the collecting systems of both kidneys. By report the patient did have a recent contrast enhanced CT exam in this in [place], [place], and the residual renal parenchymal contrast is indicative of acute renal injury and renal dysfunction. Bilateral mild renal atrophy. A Foley catheter is present in the urinary bladder. No current hydronephrosis. Portions the ureters are effaced in the vicinity of the large abdominopelvic mass. Stomach/Bowel: Displacement of bowel by the large abdominopelvic mass. Sigmoid colon diverticulosis without active diverticulitis. Orally administered contrast makes its way through to the rectum. The appendix appears normal and trace over the upper right side of the abdominopelvic mass. Vascular/Lymphatic: Aortoiliac atherosclerotic vascular disease. There scattered nodularity in the mesentery without well-defined individual lymph nodes. Reproductive: Inseparable from the uterus and ovaries, there is a 25.7 by 15.7 by 24.5 cm (volume = 5180 cm^3) heterogeneous mass filling much of the pelvis and extending up into the abdomen well below the level of the umbilicus. The mass is mildly eccentric to the right side  in its upper portion but fairly midline in the lower portion and effaces the urinary bladder there are potentially small punctate calcifications along the anterior inferior margin of the mass for example on images 74-75 of series 3. The ovaries and uterus are not separately individually identifiable. Other: Perihepatic and perisplenic ascites along with ascites tracking along the paracolic gutters. Diffuse infiltration of the omentum with reticular and faintly nodular pattern. Similar  reticular and nodular infiltration of the mesentery. Along the right lower abdominal wall on image 81/3 there is a small amount of subcutaneous stranding and gas. Correlate with any recent injection along the lower abdominal wall. Musculoskeletal: Lumbar spondylosis and degenerative disc disease with grade 1 degenerative anterior subluxation at L3-4 and L4-5, and suspected impingement at L3-4 on the right at L4-5. No findings osseous metastatic disease. IMPRESSION: 1. Large pelvic mass obscuring the uterus/ovaries and extending into the upper abdomen, size estimated at 5180 cubic cm, heterogeneous in appearance, with associated ascites and infiltration and nodularity of the omentum and mesentery. 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 Mucinous gastrointestinal tumors can also have peritoneal spread, but the association of the lesion with the pelvic organs favors ovarian origin. The large mass displaces surrounding structures such as the bowel and ureters. No hydronephrosis currently. 2. Persistent nephrogram related to the patient's outside CT scan from [facility]. Persistent contrast medium in the renal parenchyma indicates prominent renal dysfunction. 3. Nodularity in the left lateral breast, nonspecific and possibly simply due to glandular tissue. Correlate with mammographic history. 4. Moderate-sized type 3 hiatal hernia with contrast in the hernia and distal esophagus suggesting dysmotility or  reflux. 5. 2.9 by 1.3 cm area of soft tissue density nodularity adjacent to the herniated stomach in the lower chest, concerning for possible tumor extension along the hiatal hernia. 6. Other imaging findings of potential clinical significance: Mild bibasilar atelectasis. Aortic Atherosclerosis (ICD10-I70.0). Coronary atherosclerosis with aortic valve calcification. Sigmoid colon diverticulosis. Lumbar spondylosis and degenerative disc disease with potential impingement at L3-4 and L4-5. Electronically Signed   By: Van Clines M.D.   On: 11/21/2017 17:47   Dg Chest Portable 1 View  Result Date: 11/19/2017 CLINICAL DATA:  Shock.  History ARF.  Hypertension. EXAM: PORTABLE CHEST 1 VIEW COMPARISON:  No recent prior. FINDINGS: Mediastinum and hilar structures normal. Heart size normal. Low lung volumes with bibasilar atelectasis. No pleural effusion or pneumothorax. No acute bony abnormality. IMPRESSION: Low lung volumes with bibasilar atelectasis. Electronically Signed   By: Marcello Moores  Register   On: 11/19/2017 12:06    Review of Systems  Constitutional: Positive for weight loss. Negative for chills.  Gastrointestinal: Positive for abdominal pain, constipation and nausea.   Blood pressure (!) 99/52, pulse 77, temperature (!) 97.5 F (36.4 C), temperature source Oral, resp. rate 18, height 5' 5"  (1.651 m), weight 176 lb 2.4 oz (79.9 kg), SpO2 92 %. Physical Exam  Constitutional: She appears well-developed. No distress.  HENT:  Head: Normocephalic and atraumatic.  Neck: Neck supple.  Cardiovascular: Normal rate.  Respiratory: Effort normal and breath sounds normal.  GI: There is no tenderness. There is no rebound.  Distended, no discrete mass    Assessment/Plan: Newly diagnosed pelvic mass worrisome for EOC.  The possibility of this diagnosis was reviewed with the patient.  She is recovering from hypovolemic shock, AKI.  In light of her borderline functional/performance status she may be a  candidate for neoadjuvant chemotherapy.  Her serum creatinine is downward trending.  >tumor markers >recommend paracentesis or image-guided omental biopsy to establish a tissue diagnosis >will follow with you; Dr Denman George or Dr. Gerarda Fraction will re-consult on 7/22    Lahoma Crocker 11/20/2017, 4:04 PM

## 2017-11-20 NOTE — Progress Notes (Signed)
[facility] ROUNDING NOTE   Subjective:   Interval History: The patient was resting in her room today upon entering. She denied acute complaints but stated that she did not feel well failing to specify a cause for her symptoms.  Objective:  Vital signs in last 24 hours:  Temp:  [97.6 F (36.4 C)-98.1 F (36.7 C)] 98 F (36.7 C) (07/20 0551) Pulse Rate:  [68-84] 82 (07/20 0551) Resp:  [18-30] 18 (07/20 0551) BP: (88-129)/(44-86) 99/44 (07/20 0551) SpO2:  [91 %-97 %] 91 % (07/19 2133)  Weight change:  Filed Weights   11/02/2017 1243 11/19/17 0500  Weight: 174 lb 13.2 oz (79.3 kg) 176 lb 2.4 oz (79.9 kg)    Intake/Output: I/O last 3 completed shifts: In: 5275.2 [P.O.:150; I.V.:4726.2; IV Piggyback:399] Out: 525 [Urine:525]   Intake/Output this shift:  No intake/output data recorded.  Physical Exam: General: A/O x4, in no acute distress, afebrile, nondiphoretic CVS- RRR, no murmur auscultated RS- CTA bilaterall ABD- BS present soft non-distended EXT- Bilaterally lower extremities edematous +2   Basic Metabolic Panel: Recent Labs  Lab 11/27/2017 1611  11/19/17 0447 11/19/17 0828 11/19/17 1257 11/19/17 1641 11/19/17 2043  NA 125*   < > 128* 129* 132* 128* 129*  K 5.1   < > 4.8 4.5 5.0 4.8 4.7  CL 95*   < > 99 99 104 99 100  CO2 15*   < > 16* 17* 14* 16* 15*  GLUCOSE 97   < > 108* 114* 131* 137* 128*  BUN 47*   < > 47* 47* 48* 44* 46*  CREATININE 4.47*   < > 4.17* 4.07* 3.72* 3.39* 3.20*  CALCIUM 10.5*   < > 9.9 10.1 9.8 10.0 10.0  MG 1.7  --   --   --   --   --   --    < > = values in this interval not displayed.    Liver Function Tests: Recent Labs  Lab 11/28/2017 1611  AST 14*  ALT 10  ALKPHOS 94  BILITOT 0.8  PROT 6.1*  ALBUMIN 2.6*   No results for input(s): LIPASE, AMYLASE in the last 168 hours. No results for input(s): AMMONIA in the last 168 hours.  CBC: Recent Labs  Lab 11/02/2017 1611 11/19/17 0447  WBC 31.8* 28.9*  NEUTROABS  28.9*  --   HGB 10.8* 9.7*  HCT 32.4* 29.4*  MCV 93.1 93.0  PLT 445* 416*    Cardiac Enzymes: No results for input(s): CKTOTAL, CKMB, CKMBINDEX, TROPONINI in the last 168 hours.  BNP: Invalid input(s): POCBNP  CBG: Recent Labs  Lab 11/19/17 0349 11/19/17 0727 11/19/17 1112 11/19/17 1526 11/19/17 1648  GLUCAP 100* 93 110* 138* 145*    Microbiology: Results for orders placed or performed during the hospital encounter of 11/19/2017  MRSA PCR Screening     Status: None   Collection Time: 11/27/2017 12:58 PM  Result Value Ref Range Status   MRSA by PCR NEGATIVE NEGATIVE Final    Comment:        The GeneXpert MRSA Assay (FDA approved for NASAL specimens only), is one component of a comprehensive MRSA colonization surveillance program. It is not intended to diagnose MRSA infection nor to guide or monitor treatment for MRSA infections. Performed at [facility], [place] [address], [place], [place] [address]   Culture, Urine     Status: None   Collection Time: 11/30/2017  3:00 PM  Result Value Ref Range Status   Specimen  Description URINE, RANDOM  Final   Special Requests NONE  Final   Culture   Final    NO GROWTH Performed at [facility], [address] [place], [place] [address]    Report Status 11/19/2017 FINAL  Final  Culture, blood (routine x 2)     Status: None (Preliminary result)   Collection Time: 11/05/2017  4:00 PM  Result Value Ref Range Status   Specimen Description BLOOD RIGHT ANTECUBITAL  Final   Special Requests   Final    BOTTLES DRAWN AEROBIC ONLY Blood Culture results may not be optimal due to an inadequate volume of blood received in culture bottles   Culture   Final    NO GROWTH < 24 HOURS Performed at [facility], [place] [address] [place], [place] [address]    Report Status PENDING  Incomplete  Culture, blood (routine x 2)     Status: None (Preliminary result)   Collection Time: 11/28/2017  4:30 PM  Result Value Ref Range Status    Specimen Description BLOOD LEFT HAND  Final   Special Requests   Final    BOTTLES DRAWN AEROBIC ONLY Blood Culture results may not be optimal due to an inadequate volume of blood received in culture bottles   Culture   Final    NO GROWTH < 24 HOURS Performed at [facility], [place] [address] [place], [place] [address]    Report Status PENDING  Incomplete    Coagulation Studies: No results for input(s): LABPROT, INR in the last 72 hours.  Urinalysis: Recent Labs    11/22/2017 1500  COLORURINE AMBER*  LABSPEC 1.024  PHURINE 5.0  GLUCOSEU NEGATIVE  HGBUR LARGE*  BILIRUBINUR NEGATIVE  KETONESUR NEGATIVE  PROTEINUR 100*  NITRITE NEGATIVE  LEUKOCYTESUR MODERATE*      Imaging: Ct Abdomen Pelvis Wo Contrast  Result Date: 11/22/2017 CLINICAL DATA:  Pelvic mass EXAM: CT ABDOMEN AND PELVIS WITHOUT CONTRAST TECHNIQUE: Multidetector CT imaging of the abdomen and pelvis was performed following the standard protocol without IV contrast. COMPARISON:  None. FINDINGS: Lower chest: There is mild atelectasis in both lower lobes and in the right middle lobe. Moderate-sized type 3 hiatal hernia with contrast medium both in the hiatal hernia and in the distal esophagus suggesting dysmotility or reflux. 2.9 by 1.3 cm region soft tissue density adjacent to the stomach in the chest on image 14/3, possibly from adenopathy or tumor. There is mild nodularity in the lateral breast measuring about 1.7 by 1.4 cm on image 9/3, nonspecific. Coronary atherosclerosis noted. Aortic valve calcification is present. Hepatobiliary: Layering density in the gallbladder potentially from vicarious excretion of contrast or concentrated sludge. No definite biliary dilatation although the extrahepatic biliary tree is indistinct due to surrounding a densities. Pancreas: Unremarkable Spleen: Unremarkable Adrenals/Urinary Tract: Contrast medium was not administered on today's exam, but there is corticomedullary contrast in the  kidneys along contrast in the collecting systems of both kidneys. By report the patient did have a recent contrast enhanced CT exam in this in [place], [place], and the residual renal parenchymal contrast is indicative of acute renal injury and renal dysfunction. Bilateral mild renal atrophy. A Foley catheter is present in the urinary bladder. No current hydronephrosis. Portions the ureters are effaced in the vicinity of the large abdominopelvic mass. Stomach/Bowel: Displacement of bowel by the large abdominopelvic mass. Sigmoid colon diverticulosis without active diverticulitis. Orally administered contrast makes its way through to the rectum. The appendix appears normal and  trace over the upper right side of the abdominopelvic mass. Vascular/Lymphatic: Aortoiliac atherosclerotic vascular disease. There scattered nodularity in the mesentery without well-defined individual lymph nodes. Reproductive: Inseparable from the uterus and ovaries, there is a 25.7 by 15.7 by 24.5 cm (volume = 5180 cm^3) heterogeneous mass filling much of the pelvis and extending up into the abdomen well below the level of the umbilicus. The mass is mildly eccentric to the right side in its upper portion but fairly midline in the lower portion and effaces the urinary bladder there are potentially small punctate calcifications along the anterior inferior margin of the mass for example on images 74-75 of series 3. The ovaries and uterus are not separately individually identifiable. Other: Perihepatic and perisplenic ascites along with ascites tracking along the paracolic gutters. Diffuse infiltration of the omentum with reticular and faintly nodular pattern. Similar reticular and nodular infiltration of the mesentery. Along the right lower abdominal wall on image 81/3 there is a small amount of subcutaneous stranding and gas. Correlate with any recent injection along the lower abdominal wall. Musculoskeletal: Lumbar spondylosis and  degenerative disc disease with grade 1 degenerative anterior subluxation at L3-4 and L4-5, and suspected impingement at L3-4 on the right at L4-5. No findings osseous metastatic disease. IMPRESSION: 1. Large pelvic mass obscuring the uterus/ovaries and extending into the upper abdomen, size estimated at 5180 cubic cm, heterogeneous in appearance, with associated ascites and infiltration and nodularity of the omentum and mesentery. 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 Mucinous gastrointestinal tumors can also have peritoneal spread, but the association of the lesion with the pelvic organs favors ovarian origin. The large mass displaces surrounding structures such as the bowel and ureters. No hydronephrosis currently. 2. Persistent nephrogram related to the patient's outside CT scan from [facility]. Persistent contrast medium in the renal parenchyma indicates prominent renal dysfunction. 3. Nodularity in the left lateral breast, nonspecific and possibly simply due to glandular tissue. Correlate with mammographic history. 4. Moderate-sized type 3 hiatal hernia with contrast in the hernia and distal esophagus suggesting dysmotility or reflux. 5. 2.9 by 1.3 cm area of soft tissue density nodularity adjacent to the herniated stomach in the lower chest, concerning for possible tumor extension along the hiatal hernia. 6. Other imaging findings of potential clinical significance: Mild bibasilar atelectasis. Aortic Atherosclerosis (ICD10-I70.0). Coronary atherosclerosis with aortic valve calcification. Sigmoid colon diverticulosis. Lumbar spondylosis and degenerative disc disease with potential impingement at L3-4 and L4-5. Electronically Signed   By: Van Clines M.D.   On: 11/22/2017 17:47   Dg Chest Portable 1 View  Result Date: 11/19/2017 CLINICAL DATA:  Shock.  History ARF.  Hypertension. EXAM: PORTABLE CHEST 1 VIEW COMPARISON:  No recent prior. FINDINGS: Mediastinum and  hilar structures normal. Heart size normal. Low lung volumes with bibasilar atelectasis. No pleural effusion or pneumothorax. No acute bony abnormality. IMPRESSION: Low lung volumes with bibasilar atelectasis. Electronically Signed   By: Marcello Moores  Register   On: 11/19/2017 12:06     Medications:   . sodium chloride    .  sodium bicarbonate (isotonic) infusion in sterile water 75 mL/hr at 11/20/17 0549   . feeding supplement (ENSURE ENLIVE)  237 mL Oral BID BM  . heparin  5,000 Units Subcutaneous Q8H  . mouth rinse  15 mL Mouth Rinse BID  . pneumococcal 23 valent vaccine  0.5 mL Intramuscular Tomorrow-1000  . polyethylene glycol  17 g Oral Daily  . senna-docusate  1 tablet Oral Daily   sodium  chloride, acetaminophen, ondansetron (ZOFRAN) IV  Assessment/ Plan:  [facility] is a 84 yo F w/ a PMHx notable for HTN on an ARB and HLD who presented to [facility] on 07/17 w/ concern for chronic abdominal pain that acutely worsened of late with associated weakness. The patient attested to consuming high amounts of Aleve for several days for this pain. Prior Cr appears to have been 1.6 at [facility] but up to 4.3 today with a potassium of 6.1 down to 4.5 by day two. CT Abd demonstrated a large mass w/ omental extension but without hydronephrosis. She was transferred to [facility] due to decreased Hgb for a higher level of care in an ICU. She was transferred from there to the floor as she has stabilized. She continues to improve and should be stable from a renal standpoint for transfer to [facility] if her Cr continues to improve today.   Plan:  Acute renal injury-Cr. 1.38 on 7/15. Up to 4.3 on 07/18. I agee that the NSAID use in addition to the ARB with even transient hypotension/dehydration can lead to renal injury. As such I agree with holding the ARB, discontinuing NSAIDS and gentle rehydration.    ? Improving to 3.2 as of last BMP from a high here of 4.49. Unable to obtain labs today. May need to  place an IV to draw labs as we will need to know her renal function to move forward.  ? Cautious rehydration given her bilateral pitting edema, lungs clear to auscultation  ANEMIA-Hgb 9.7, stable over 24 hours. Normocytic anemia. No signs of acute blood loss. Monitor   HTN/VOL-Patient hypotensive. Hold antihypertensives, agree with fluid rehydration.   Hyperkalemia-Resolved. Monitor with daily BMPs  Hyponatremia-Caution raising sodium greater than 31mmol/L per 24 hours. May need to alternate IV solution with NaCl to improve   LOS: 2 Kathi Ludwig, MD Internal Medicine PGY-2

## 2017-11-20 NOTE — Progress Notes (Signed)
Physical Therapy Treatment Patient Details Name: [facility] MRN: [id number] DOB: [birth year] Today's Date: 11/20/2017    History of Present Illness Pt is a 84 y.o. F with significant PMH of hypertension, hyperlipidemia, admitted with abdominal pain, constipation, large pelvic mass, developed AKI, hyperkalemia, and hypovalemic shock.     PT Comments    Patient presents with functional limitations in mobility and transfers due to decreased endurance, functional strength deficits and abdominal pain. Ambulating 50 feet with walker and min guard assist with forward trunk flexion throughout. Patient will benefit from continued PT to increase their independence and safety with mobility to allow discharge to home with HHPT.    Follow Up Recommendations  Home health PT;Supervision for mobility/OOB     Equipment Recommendations  Other (comment)(Rollator)    Recommendations for Other Services       Precautions / Restrictions Precautions Precautions: Fall Restrictions Weight Bearing Restrictions: No    Mobility  Bed Mobility               General bed mobility comments: Sitting EOB on arrival  Transfers Overall transfer level: Needs assistance Equipment used: Rolling walker (2 wheeled) Transfers: Sit to/from Stand Sit to Stand: Supervision            Ambulation/Gait Ambulation/Gait assistance: Min guard Gait Distance (Feet): 50 Feet Assistive device: Rolling walker (2 wheeled);None Gait Pattern/deviations: Step-through pattern;Trunk flexed Gait velocity: decreased   General Gait Details: Requiring multimodal cues for walker proximity and hip extension but patient unable to maintain. One standing rest break required. Also able to walk 5 feet in room with no device and min guard assist.   Stairs             Wheelchair Mobility    Modified Rankin (Stroke Patients Only)       Balance Overall balance assessment: Mild deficits observed, not formally tested                                          Cognition Arousal/Alertness: Awake/alert Behavior During Therapy: WFL for tasks assessed/performed Overall Cognitive Status: Within Functional Limits for tasks assessed                                 General Comments: Very pleasant and soft spoken      Exercises      General Comments        Pertinent Vitals/Pain Pain Assessment: Faces Faces Pain Scale: Hurts even more Pain Location: low abdominal pain Pain Descriptors / Indicators: Aching Pain Intervention(s): Limited activity within patient's tolerance;Monitored during session;Other (comment)(RN aware)    Home Living Family/patient expects to be discharged to:: Private residence Living Arrangements: Spouse/significant other Available Help at Discharge: Family;Available 24 hours/day Type of Home: House Home Access: Stairs to enter Entrance Stairs-Rails: Left Home Layout: Two level;Able to live on main level with bedroom/bathroom Home Equipment: Walker - 4 wheels      Prior Function Level of Independence: Independent          PT Goals (current goals can now be found in the care plan section) Acute Rehab PT Goals Patient Stated Goal: be independent again PT Goal Formulation: With patient Time For Goal Achievement: 12/04/17 Potential to Achieve Goals: Fair    Frequency    Min 3X/week      PT Plan  Co-evaluation              AM-PAC PT "6 Clicks" Daily Activity  Outcome Measure  Difficulty turning over in bed (including adjusting bedclothes, sheets and blankets)?: None Difficulty moving from lying on back to sitting on the side of the bed? : A Little Difficulty sitting down on and standing up from a chair with arms (e.g., wheelchair, bedside commode, etc,.)?: A Little Help needed moving to and from a bed to chair (including a wheelchair)?: A Little Help needed walking in hospital room?: A Little Help needed climbing 3-5  steps with a railing? : A Little 6 Click Score: 19    End of Session Equipment Utilized During Treatment: Gait belt Activity Tolerance: Patient limited by fatigue Patient left: Other (comment);with nursing/sitter in room;with family/visitor present(sitting EOB) Nurse Communication: Mobility status PT Visit Diagnosis: Unsteadiness on feet (R26.81);Muscle weakness (generalized) (M62.81);Difficulty in walking, not elsewhere classified (R26.2);Pain Pain - part of body: (abdomen)     Time: 2951-8841 PT Time Calculation (min) (ACUTE ONLY): 20 min  Charges:                       G Codes:      Ellamae Sia, PT, DPT Acute Rehabilitation Services  Pager: [phone]   Willy Eddy 11/20/2017, 2:06 PM

## 2017-11-21 ENCOUNTER — Inpatient Hospital Stay (HOSPITAL_COMMUNITY): Payer: Medicare Other

## 2017-11-21 DIAGNOSIS — C569 Malignant neoplasm of unspecified ovary: Secondary | ICD-10-CM

## 2017-11-21 LAB — RENAL FUNCTION PANEL
ANION GAP: 16 — AB (ref 5–15)
Albumin: 2 g/dL — ABNORMAL LOW (ref 3.5–5.0)
BUN: 57 mg/dL — ABNORMAL HIGH (ref 8–23)
CHLORIDE: 91 mmol/L — AB (ref 98–111)
CO2: 23 mmol/L (ref 22–32)
Calcium: 10.1 mg/dL (ref 8.9–10.3)
Creatinine, Ser: 3.03 mg/dL — ABNORMAL HIGH (ref 0.44–1.00)
GFR calc non Af Amer: 13 mL/min — ABNORMAL LOW (ref >60)
GFR, EST AFRICAN AMERICAN: 15 mL/min — AB (ref >60)
Glucose, Bld: 156 mg/dL — ABNORMAL HIGH (ref 70–99)
Phosphorus: 6 mg/dL — ABNORMAL HIGH (ref 2.5–4.6)
Potassium: 3.8 mmol/L (ref 3.5–5.1)
Sodium: 130 mmol/L — ABNORMAL LOW (ref 135–145)

## 2017-11-21 MED ORDER — FUROSEMIDE 10 MG/ML IJ SOLN
60.0000 mg | Freq: Once | INTRAMUSCULAR | Status: AC
  Administered 2017-11-21: 60 mg via INTRAVENOUS
  Filled 2017-11-21: qty 6

## 2017-11-21 MED ORDER — TRAMADOL HCL 50 MG PO TABS
50.0000 mg | ORAL_TABLET | Freq: Two times a day (BID) | ORAL | Status: DC | PRN
  Administered 2017-11-22: 50 mg via ORAL
  Filled 2017-11-21: qty 1

## 2017-11-21 NOTE — Progress Notes (Signed)
PROGRESS NOTE    Diane Martin  IOX:[id number] DOB: 1931-12-21 DOA: 11/21/2017 PCP: Default, Provider, MD    Brief Narrative:  84 year old with hypertension, hyperlipidemia admitted to hospital in [place] for abdominal pain, constipation, large pelvic mass, developed AKI with a creatinine of 4.3, hyperkalemia. CT abdomen and pelvis without contrast showed a pelvic mass suspicious for gyn malignancy with peritoneal metastasis.  She developed hypotension and admitted to [facility] service and transferred to ICU. She was resuscitated with IV fluids and transferred to [facility] service on 7/21.   Assessment & Plan:   Active Problems:   Acute kidney injury ([place])   Pelvic mass   Shock ([place])   Acute renal failure (ARF) (HCC)   Malnutrition of moderate degree   Hypovolemic shock:  Initially admitted to ICU under PCCM service.  Did not require vasopressors.  On IV fluids for resuscitation.  BP parameters are still borderline. Pt reports abdominal discomfort.      AKI with metabolic acidosis.  Suspect secondary to a combination of hypovolemia, contrast induced nephropathy , NSAIDS, ARB use.  Improvement from 4.3 to 3.2 >> 2.9 Continue with gentle hydration and bicarb infusion.  Further management as per nephrology.    Heterogenous ovarian mass with peritoneal carcinomatosis:  - ob gyn consulted, pt's family wanted to talk to [facility] GYN in house before initiating transfer to [facility].  - pain control with tramadol oral and IV morphine. Today patient is sleepy, probably from morphine, will d/c IV morphine and decrease the dose of tramadol to 50 mg - senna colace and miralax for constipation.  - tumour markers ordered by OBGYN and IR consulted for omental biopsy.  - discussed the plan with the patient's family.    Anemia of chronic disease:  Hemoglobin stable around 9.  No signs of acute blood loss.  Transfuse to keep hemoglobin greater than 7.     Hyperkalemia:  Resolved.     Hyponatremia:  ? Unclear etiology. Sodium at 130, suspect a component of fluid overload, with ascites and pedal edema.    Leukocytosis with thrombocytosis : wbc count at 33.9 Suspect reactive.  No signs of infection.   H/o Hypertension:  bp parameters borderline. Holding home bp medications.    Hyperlipidemia: on pravachol at home.      DVT prophylaxis: sq heparin.  Code Status: FULL CODE.  Family Communication: daughters at bedside. Discussed the plan  Disposition Plan: pending clinical improvement, omental biopsy   Consultants:   Ob Gyn- Dr Glennon MacLaurance Flatten.   Nephrology   Procedures: none.   Antimicrobials: none.    Subjective: Sleepy, but answering questions appropriately.   Objective: Vitals:   11/21/17 0800 11/21/17 0810 11/21/17 0830 11/21/17 1044  BP:    (!) 90/56  Pulse:    80  Resp:    18  Temp:    98.2 F (36.8 C)  TempSrc:      SpO2: 93% (!) 89% 95% 90%  Weight:      Height:        Intake/Output Summary (Last 24 hours) at 11/21/2017 1143 Last data filed at 11/21/2017 1039 Gross per 24 hour  Intake 2557.2 ml  Output -  Net 2557.2 ml   Filed Weights   11/04/2017 1243 11/19/17 0500  Weight: 79.3 kg (174 lb 13.2 oz) 79.9 kg (176 lb 2.4 oz)    Examination:  General exam: chronically ill looking lady, not in acute distress. On 2lit of [place] oxygen.  Respiratory system: diminished at bases, no  wheezing or rhonchi.  Cardiovascular system: S1 & S2 heard, RRR. No JVD,   Gastrointestinal system: Abdomen is non tender non distended bowel sounds good.  Central nervous system: Alert and oriented. Non focal.  Extremities: leg edema 2+ Skin: lower extremities have multiple keratosis / dermatosis papules, non itchy.  Psychiatry:Mood & affect appropriate.     Data Reviewed: I have personally reviewed following labs and imaging studies  CBC: Recent Labs  Lab 11/15/2017 1611 11/19/17 0447 11/20/17 1210  WBC 31.8* 28.9* 33.9*  NEUTROABS 28.9*  --    --   HGB 10.8* 9.7* 10.7*  HCT 32.4* 29.4* 32.0*  MCV 93.1 93.0 91.7  PLT 445* 416* 638*   Basic Metabolic Panel: Recent Labs  Lab 11/30/2017 1611  11/19/17 0828 11/19/17 1257 11/19/17 1641 11/19/17 2043 11/20/17 1210  NA 125*   < > 129* 132* 128* 129* 130*  K 5.1   < > 4.5 5.0 4.8 4.7 3.8  CL 95*   < > 99 104 99 100 95*  CO2 15*   < > 17* 14* 16* 15* 20*  GLUCOSE 97   < > 114* 131* 137* 128* 163*  BUN 47*   < > 47* 48* 44* 46* 50*  CREATININE 4.47*   < > 4.07* 3.72* 3.39* 3.20* 2.97*  CALCIUM 10.5*   < > 10.1 9.8 10.0 10.0 10.0  MG 1.7  --   --   --   --   --   --   PHOS  --   --   --   --   --   --  5.3*   < > = values in this interval not displayed.   GFR: Estimated Creatinine Clearance: 14.7 mL/min (A) (by C-G formula based on SCr of 2.97 mg/dL (H)). Liver Function Tests: Recent Labs  Lab 11/22/2017 1611 11/20/17 1210  AST 14*  --   ALT 10  --   ALKPHOS 94  --   BILITOT 0.8  --   PROT 6.1*  --   ALBUMIN 2.6* 2.2*   No results for input(s): LIPASE, AMYLASE in the last 168 hours. No results for input(s): AMMONIA in the last 168 hours. Coagulation Profile: No results for input(s): INR, PROTIME in the last 168 hours. Cardiac Enzymes: No results for input(s): CKTOTAL, CKMB, CKMBINDEX, TROPONINI in the last 168 hours. BNP (last 3 results) No results for input(s): PROBNP in the last 8760 hours. HbA1C: No results for input(s): HGBA1C in the last 72 hours. CBG: Recent Labs  Lab 11/19/17 0349 11/19/17 0727 11/19/17 1112 11/19/17 1526 11/19/17 1648  GLUCAP 100* 93 110* 138* 145*   Lipid Profile: No results for input(s): CHOL, HDL, LDLCALC, TRIG, CHOLHDL, LDLDIRECT in the last 72 hours. Thyroid Function Tests: No results for input(s): TSH, T4TOTAL, FREET4, T3FREE, THYROIDAB in the last 72 hours. Anemia Panel: No results for input(s): VITAMINB12, FOLATE, FERRITIN, TIBC, IRON, RETICCTPCT in the last 72 hours. Sepsis Labs: Recent Labs  Lab 11/20/2017 1602  11/20/2017 1611 11/27/2017 1845 11/19/17 0447  PROCALCITON  --  0.85  --  0.99  LATICACIDVEN 2.4*  --  2.4*  --     Recent Results (from the past 240 hour(s))  MRSA PCR Screening     Status: None   Collection Time: 11/29/2017 12:58 PM  Result Value Ref Range Status   MRSA by PCR NEGATIVE NEGATIVE Final    Comment:        The GeneXpert MRSA Assay (FDA approved for NASAL specimens  only), is one component of a comprehensive MRSA colonization surveillance program. It is not intended to diagnose MRSA infection nor to guide or monitor treatment for MRSA infections. Performed at [facility], [place] [address], [place], [place] [address]   Culture, Urine     Status: None   Collection Time: 11/06/2017  3:00 PM  Result Value Ref Range Status   Specimen Description URINE, RANDOM  Final   Special Requests NONE  Final   Culture   Final    NO GROWTH Performed at [facility], [place] [address] [place], [place] [address]    Report Status 11/19/2017 FINAL  Final  Culture, blood (routine x 2)     Status: None (Preliminary result)   Collection Time: 11/17/2017  4:00 PM  Result Value Ref Range Status   Specimen Description BLOOD RIGHT ANTECUBITAL  Final   Special Requests   Final    BOTTLES DRAWN AEROBIC ONLY Blood Culture results may not be optimal due to an inadequate volume of blood received in culture bottles   Culture   Final    NO GROWTH 2 DAYS Performed at [facility], [place] [address], [place], [place] [address]    Report Status PENDING  Incomplete  Culture, blood (routine x 2)     Status: None (Preliminary result)   Collection Time: 11/25/2017  4:30 PM  Result Value Ref Range Status   Specimen Description BLOOD LEFT HAND  Final   Special Requests   Final    BOTTLES DRAWN AEROBIC ONLY Blood Culture results may not be optimal due to an inadequate volume of blood received in culture bottles   Culture   Final    NO GROWTH 2 DAYS Performed at [facility], [place]  [address] [place], [place] [address]    Report Status PENDING  Incomplete         Radiology Studies: No results found.      Scheduled Meds: . feeding supplement (ENSURE ENLIVE)  237 mL Oral BID BM  . heparin  5,000 Units Subcutaneous Q8H  . mouth rinse  15 mL Mouth Rinse BID  . pneumococcal 23 valent vaccine  0.5 mL Intramuscular Tomorrow-1000  . polyethylene glycol  17 g Oral Daily  . senna-docusate  2 tablet Oral BID   Continuous Infusions: . sodium chloride    .  sodium bicarbonate (isotonic) infusion in sterile water Stopped (11/21/17 1039)     LOS: 3 days    Time spent: 35 minutes.     Hosie Poisson, MD [facility] Pager [phone]   If 7PM-7AM, please contact night-coverage [email or web address] Password [facility] 11/21/2017, 11:43 AM

## 2017-11-21 NOTE — Progress Notes (Signed)
   Patient Status: [facility] - In-pt  Assessment and Plan: Patient in need of venous access.   Tunneled central catheter placement  ______________________________________________________________________   History of Present Illness: Diane Martin is a 84 y.o. female   Omental pelvic mass for bx 7/22 No IV access for meds; blood work....... Creatinine 2.97  Allergies and medications reviewed.   Review of Systems: A 12 point ROS discussed and pertinent positives are indicated in the HPI above.  All other systems are negative.   Vital Signs: BP (!) 96/51 (BP Location: Left Arm)   Pulse 96   Temp 97.7 F (36.5 C) (Oral)   Resp 20   Ht 5\' 5"  (1.651 m)   Wt 176 lb 2.4 oz (79.9 kg)   SpO2 92%   BMI 29.31 kg/m   Physical Exam  Constitutional: She is oriented to person, place, and time.  Cardiovascular: Normal rate and regular rhythm.  Pulmonary/Chest: Effort normal and breath sounds normal.  Musculoskeletal: Normal range of motion.  Neurological: She is alert and oriented to person, place, and time.  Skin: Skin is warm and dry.  Psychiatric: She has a normal mood and affect. Her behavior is normal. Judgment and thought content normal.  Vitals reviewed.    Imaging reviewed.   Labs:  COAGS: No results for input(s): INR, APTT in the last 8760 hours.  BMP: Recent Labs    11/19/17 1257 11/19/17 1641 11/19/17 2043 11/20/17 1210  NA 132* 128* 129* 130*  K 5.0 4.8 4.7 3.8  CL 104 99 100 95*  CO2 14* 16* 15* 20*  GLUCOSE 131* 137* 128* 163*  BUN 48* 44* 46* 50*  CALCIUM 9.8 10.0 10.0 10.0  CREATININE 3.72* 3.39* 3.20* 2.97*  GFRNONAA 10* 11* 12* 13*  GFRAA 12* 13* 14* 16*    Pt and family is aware of procedure benefits and risks including but not limited to'infection; bleeding; vessel damage Agreeable to proceed   Electronically Signed: Mabrey Howland A, PA-C 11/21/2017, 9:42 AM   I spent a total of 15 minutes in face to face in clinical consultation,  greater than 50% of which was counseling/coordinating care for venous access.Patient ID: Diane Martin, female   DOB: 11/27/1931, 84 y.o.   MRN: [id number]

## 2017-11-21 NOTE — Consult Note (Signed)
Chief Complaint: Patient was seen in consultation today for omental /pelvic mass biopsy at the request of Dr Teodoro Spray   Supervising Physician: Daryll Brod  Patient Status: [facility] - In-pt  History of Present Illness: Diane Martin is a 84 y.o. female   Admitted ror evaluation of abdominal bloating; enlarging Nausea; abd pain for months CT 7/18: IMPRESSION: 1. Large pelvic mass obscuring the uterus/ovaries and extending into the upper abdomen, size estimated at 5180 cubic cm, heterogeneous in appearance, with associated ascites and infiltration and nodularity of the omentum and mesentery. 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 Mucinous gastrointestinal tumors can also have peritoneal spread, but the association of the lesion with the pelvic organs favors ovarian origin. The large mass displaces surrounding structures such as the bowel and ureters. No hydronephrosis currently. 2. Persistent nephrogram related to the patient's outside CT scan from [facility]. Persistent contrast medium in the renal parenchyma indicates prominent renal dysfunction. 3. Nodularity in the left lateral breast, nonspecific and possibly simply due to glandular tissue. Correlate with mammographic history. 4. Moderate-sized type 3 hiatal hernia with contrast in the hernia and distal esophagus suggesting dysmotility or reflux. 5. 2.9 by 1.3 cm area of soft tissue density nodularity adjacent to the herniated stomach in the lower chest, concerning for possible tumor extension along the hiatal hernia.  Request made per Oncology for biopay Imaging reviewed with Dr Lyndel Pleasure procedure Scheduled for 7/22   Past Medical History:  Diagnosis Date  . ARF (acute renal failure) ([place]) 11/2017  . Hyperlipidemia   . Hyperlipidemia   . Hypertension   . Pelvic mass 11/2017    Past Surgical History:  Procedure Laterality Date  . NO PAST SURGERIES       Allergies: Patient has no known allergies.  Medications: Prior to Admission medications   Medication Sig Start Date End Date Taking? Authorizing Provider  atenolol (TENORMIN) 25 MG tablet Take by mouth 2 (two) times daily.   Yes [provider]  co-enzyme Q-10 30 MG capsule Take 30 mg by mouth daily.   Yes [provider]  losartan-hydrochlorothiazide (HYZAAR) 50-12.5 MG tablet Take 1 tablet by mouth daily.   Yes [provider]  naproxen sodium (ALEVE) 220 MG tablet Take 220 mg by mouth as needed (pain).   Yes [provider]  ondansetron (ZOFRAN) 4 MG tablet Take 4 mg by mouth every 8 (eight) hours as needed for nausea or vomiting.   Yes [provider]  oxyCODONE-acetaminophen (PERCOCET) 7.5-325 MG tablet Take 1 tablet by mouth every 6 (six) hours as needed for severe pain.   Yes [provider]  pravastatin (PRAVACHOL) 10 MG tablet Take 10 mg by mouth daily.   Yes [provider]  triamcinolone cream (KENALOG) 0.1 % Apply 1 application topically as needed for rash. 10/28/17  Yes [provider]     History reviewed. No pertinent family history.  Social History   Socioeconomic History  . Marital status: Married    Spouse name: Not on file  . Number of children: Not on file  . Years of education: Not on file  . Highest education level: Not on file  Occupational History  . Not on file  Social Needs  . Financial resource strain: Not on file  . Food insecurity:    Worry: Not on file    Inability: Not on file  . Transportation needs:    Medical: Not on file    Non-medical: Not on  file  Tobacco Use  . Smoking status: Never Smoker  . Smokeless tobacco: Never Used  Substance and Sexual Activity  . Alcohol use: Not Currently  . Drug use: Never  . Sexual activity: Not Currently  Lifestyle  . Physical activity:    Days per week: Not on file    Minutes per session: Not on file  . Stress: Not on file   Relationships  . Social connections:    Talks on phone: Not on file    Gets together: Not on file    Attends religious service: Not on file    Active member of club or organization: Not on file    Attends meetings of clubs or organizations: Not on file    Relationship status: Not on file  Other Topics Concern  . Not on file  Social History Narrative  . Not on file    Review of Systems: A 12 point ROS discussed and pertinent positives are indicated in the HPI above.  All other systems are negative.  Review of Systems  Constitutional: Positive for activity change, appetite change, fatigue and unexpected weight change. Negative for fever.  Respiratory: Positive for shortness of breath.   Gastrointestinal: Positive for abdominal distention, abdominal pain and nausea.  Musculoskeletal: Positive for gait problem.  Neurological: Positive for weakness.  Psychiatric/Behavioral: Negative for behavioral problems and confusion.    Vital Signs: BP (!) 96/51 (BP Location: Left Arm)   Pulse 96   Temp 97.7 F (36.5 C) (Oral)   Resp 20   Ht 5\' 5"  (1.651 m)   Wt 176 lb 2.4 oz (79.9 kg)   SpO2 92%   BMI 29.31 kg/m   Physical Exam  Constitutional: She is oriented to person, place, and time.  Cardiovascular: Normal rate, regular rhythm and normal heart sounds.  Pulmonary/Chest: Effort normal and breath sounds normal.  Abdominal: Bowel sounds are normal. She exhibits distension and mass. There is tenderness.  Musculoskeletal: Normal range of motion.  Neurological: She is alert and oriented to person, place, and time.  Skin: Skin is warm and dry.  Psychiatric: She has a normal mood and affect. Her behavior is normal. Judgment and thought content normal.  Vitals reviewed.   Imaging: Ct Abdomen Pelvis Wo Contrast  Result Date: 11/04/2017 CLINICAL DATA:  Pelvic mass EXAM: CT ABDOMEN AND PELVIS WITHOUT CONTRAST TECHNIQUE: Multidetector CT imaging of the abdomen and pelvis was performed  following the standard protocol without IV contrast. COMPARISON:  None. FINDINGS: Lower chest: There is mild atelectasis in both lower lobes and in the right middle lobe. Moderate-sized type 3 hiatal hernia with contrast medium both in the hiatal hernia and in the distal esophagus suggesting dysmotility or reflux. 2.9 by 1.3 cm region soft tissue density adjacent to the stomach in the chest on image 14/3, possibly from adenopathy or tumor. There is mild nodularity in the lateral breast measuring about 1.7 by 1.4 cm on image 9/3, nonspecific. Coronary atherosclerosis noted. Aortic valve calcification is present. Hepatobiliary: Layering density in the gallbladder potentially from vicarious excretion of contrast or concentrated sludge. No definite biliary dilatation although the extrahepatic biliary tree is indistinct due to surrounding a densities. Pancreas: Unremarkable Spleen: Unremarkable Adrenals/Urinary Tract: Contrast medium was not administered on today's exam, but there is corticomedullary contrast in the kidneys along contrast in the collecting systems of both kidneys. By report the patient did have a recent contrast enhanced CT exam in this in [place], [place], and the residual renal parenchymal contrast is  indicative of acute renal injury and renal dysfunction. Bilateral mild renal atrophy. A Foley catheter is present in the urinary bladder. No current hydronephrosis. Portions the ureters are effaced in the vicinity of the large abdominopelvic mass. Stomach/Bowel: Displacement of bowel by the large abdominopelvic mass. Sigmoid colon diverticulosis without active diverticulitis. Orally administered contrast makes its way through to the rectum. The appendix appears normal and trace over the upper right side of the abdominopelvic mass. Vascular/Lymphatic: Aortoiliac atherosclerotic vascular disease. There scattered nodularity in the mesentery without well-defined individual lymph nodes. Reproductive:  Inseparable from the uterus and ovaries, there is a 25.7 by 15.7 by 24.5 cm (volume = 5180 cm^3) heterogeneous mass filling much of the pelvis and extending up into the abdomen well below the level of the umbilicus. The mass is mildly eccentric to the right side in its upper portion but fairly midline in the lower portion and effaces the urinary bladder there are potentially small punctate calcifications along the anterior inferior margin of the mass for example on images 74-75 of series 3. The ovaries and uterus are not separately individually identifiable. Other: Perihepatic and perisplenic ascites along with ascites tracking along the paracolic gutters. Diffuse infiltration of the omentum with reticular and faintly nodular pattern. Similar reticular and nodular infiltration of the mesentery. Along the right lower abdominal wall on image 81/3 there is a small amount of subcutaneous stranding and gas. Correlate with any recent injection along the lower abdominal wall. Musculoskeletal: Lumbar spondylosis and degenerative disc disease with grade 1 degenerative anterior subluxation at L3-4 and L4-5, and suspected impingement at L3-4 on the right at L4-5. No findings osseous metastatic disease. IMPRESSION: 1. Large pelvic mass obscuring the uterus/ovaries and extending into the upper abdomen, size estimated at 5180 cubic cm, heterogeneous in appearance, with associated ascites and infiltration and nodularity of the omentum and mesentery. 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 Mucinous gastrointestinal tumors can also have peritoneal spread, but the association of the lesion with the pelvic organs favors ovarian origin. The large mass displaces surrounding structures such as the bowel and ureters. No hydronephrosis currently. 2. Persistent nephrogram related to the patient's outside CT scan from [facility]. Persistent contrast medium in the renal parenchyma indicates prominent renal  dysfunction. 3. Nodularity in the left lateral breast, nonspecific and possibly simply due to glandular tissue. Correlate with mammographic history. 4. Moderate-sized type 3 hiatal hernia with contrast in the hernia and distal esophagus suggesting dysmotility or reflux. 5. 2.9 by 1.3 cm area of soft tissue density nodularity adjacent to the herniated stomach in the lower chest, concerning for possible tumor extension along the hiatal hernia. 6. Other imaging findings of potential clinical significance: Mild bibasilar atelectasis. Aortic Atherosclerosis (ICD10-I70.0). Coronary atherosclerosis with aortic valve calcification. Sigmoid colon diverticulosis. Lumbar spondylosis and degenerative disc disease with potential impingement at L3-4 and L4-5. Electronically Signed   By: Van Clines M.D.   On: 11/14/2017 17:47   Dg Chest Portable 1 View  Result Date: 11/19/2017 CLINICAL DATA:  Shock.  History ARF.  Hypertension. EXAM: PORTABLE CHEST 1 VIEW COMPARISON:  No recent prior. FINDINGS: Mediastinum and hilar structures normal. Heart size normal. Low lung volumes with bibasilar atelectasis. No pleural effusion or pneumothorax. No acute bony abnormality. IMPRESSION: Low lung volumes with bibasilar atelectasis. Electronically Signed   By: Marcello Moores  Register   On: 11/19/2017 12:06    Labs:  CBC: Recent Labs    11/15/2017 1611 11/19/17 0447 11/20/17 1210  WBC 31.8* 28.9* 33.9*  HGB 10.8* 9.7* 10.7*  HCT 32.4* 29.4* 32.0*  PLT 445* 416* 466*    COAGS: No results for input(s): INR, APTT in the last 8760 hours.  BMP: Recent Labs    11/19/17 1257 11/19/17 1641 11/19/17 2043 11/20/17 1210  NA 132* 128* 129* 130*  K 5.0 4.8 4.7 3.8  CL 104 99 100 95*  CO2 14* 16* 15* 20*  GLUCOSE 131* 137* 128* 163*  BUN 48* 44* 46* 50*  CALCIUM 9.8 10.0 10.0 10.0  CREATININE 3.72* 3.39* 3.20* 2.97*  GFRNONAA 10* 11* 12* 13*  GFRAA 12* 13* 14* 16*    LIVER FUNCTION TESTS: Recent Labs    11/06/2017 1611  11/20/17 1210  BILITOT 0.8  --   AST 14*  --   ALT 10  --   ALKPHOS 94  --   PROT 6.1*  --   ALBUMIN 2.6* 2.2*    TUMOR MARKERS: No results for input(s): AFPTM, CEA, CA199, CHROMGRNA in the last 8760 hours.  Assessment and Plan:  Enlarging abd girth Palpable mass Mass seen on imaging and now for biopsy Risks and benefits discussed with the patient and family at bedside including, but not limited to bleeding, infection, damage to adjacent structures or low yield requiring additional tests.  All of the patient's questions were answered, patient is agreeable to proceed. Consent signed and in chart.    Thank you for this interesting consult.  I greatly enjoyed meeting Diane Martin and look forward to participating in their care.  A copy of this report was sent to the requesting provider on this date.  Electronically Signed: Lavonia Drafts, PA-C 11/21/2017, 8:32 AM   I spent a total of 40 Minutes    in face to face in clinical consultation, greater than 50% of which was counseling/coordinating care for omental/pelvic mass bx

## 2017-11-21 NOTE — Progress Notes (Signed)
Noted this afternoon that patient appeared to be working harder to breathe using accessory muscles and mouth breathing as if "gasping" per family member. Vitals assessed and MD notified. Telemetry applied. Patient assisted from chair to bed after using bedside commode for 25 mL urine output. Bladder scan indicated 519 mLs. MD order urinary catheter. Catheter placed without difficulty. 175 mLs amber cloudy urine returned. Family at bedside. Bed assignment received for 5W05. Report called to [place]. Bartholomew Crews, RN

## 2017-11-21 NOTE — Progress Notes (Addendum)
[facility] ROUNDING NOTE   Subjective:   Interval History: Feeling tired and weak. Difficulty eating breakfast this morning due to difficulty swallowing. Coughing while eating.   Objective:  Vital signs in last 24 hours:  Temp:  [97.4 F (36.3 C)-98.2 F (36.8 C)] 97.4 F (36.3 C) (07/21 1441) Pulse Rate:  [78-96] 79 (07/21 1441) Resp:  [18-20] 18 (07/21 1044) BP: (90-99)/(48-56) 92/50 (07/21 1441) SpO2:  [80 %-98 %] 93 % (07/21 1441)  Weight change:  Filed Weights   11/09/2017 1243 11/19/17 0500  Weight: 174 lb 13.2 oz (79.3 kg) 176 lb 2.4 oz (79.9 kg)    Intake/Output: I/O last 3 completed shifts: In: 7253 [P.O.:480; I.V.:2851] Out: -    Intake/Output this shift:  Total I/O In: 466.8 [P.O.:150; I.V.:316.8] Out: -   Physical Exam: General:chronically ill-appearing female, malnourished, well-developed, sitting up in chair in no acute distress  CVS- RRR, no murmur auscultated RS- crackles bilaterally ABD- abdomen is impressively distended with + fluid wave, but non tender and with decreased bowel sounds  EXT- 1+ Bilaterally lower extremities edematous    Basic Metabolic Panel: Recent Labs  Lab 11/04/2017 1611  11/19/17 1257 11/19/17 1641 11/19/17 2043 11/20/17 1210 11/21/17 1045  NA 125*   < > 132* 128* 129* 130* 130*  K 5.1   < > 5.0 4.8 4.7 3.8 3.8  CL 95*   < > 104 99 100 95* 91*  CO2 15*   < > 14* 16* 15* 20* 23  GLUCOSE 97   < > 131* 137* 128* 163* 156*  BUN 47*   < > 48* 44* 46* 50* 57*  CREATININE 4.47*   < > 3.72* 3.39* 3.20* 2.97* 3.03*  CALCIUM 10.5*   < > 9.8 10.0 10.0 10.0 10.1  MG 1.7  --   --   --   --   --   --   PHOS  --   --   --   --   --  5.3* 6.0*   < > = values in this interval not displayed.    Liver Function Tests: Recent Labs  Lab 12/01/2017 1611 11/20/17 1210 11/21/17 1045  AST 14*  --   --   ALT 10  --   --   ALKPHOS 94  --   --   BILITOT 0.8  --   --   PROT 6.1*  --   --   ALBUMIN 2.6* 2.2* 2.0*   No  results for input(s): LIPASE, AMYLASE in the last 168 hours. No results for input(s): AMMONIA in the last 168 hours.  CBC: Recent Labs  Lab 11/13/2017 1611 11/19/17 0447 11/20/17 1210  WBC 31.8* 28.9* 33.9*  NEUTROABS 28.9*  --   --   HGB 10.8* 9.7* 10.7*  HCT 32.4* 29.4* 32.0*  MCV 93.1 93.0 91.7  PLT 445* 416* 466*    Cardiac Enzymes: No results for input(s): CKTOTAL, CKMB, CKMBINDEX, TROPONINI in the last 168 hours.  BNP: Invalid input(s): POCBNP  CBG: Recent Labs  Lab 11/19/17 0349 11/19/17 0727 11/19/17 1112 11/19/17 1526 11/19/17 1648  GLUCAP 100* 93 110* 138* 145*    Microbiology: Results for orders placed or performed during the hospital encounter of 11/16/2017  MRSA PCR Screening     Status: None   Collection Time: 11/06/2017 12:58 PM  Result Value Ref Range Status   MRSA by PCR NEGATIVE NEGATIVE Final    Comment:        The GeneXpert MRSA  Assay (FDA approved for NASAL specimens only), is one component of a comprehensive MRSA colonization surveillance program. It is not intended to diagnose MRSA infection nor to guide or monitor treatment for MRSA infections. Performed at [facility], [place] [address] [place], [place] [address]   Culture, Urine     Status: None   Collection Time: 11/08/2017  3:00 PM  Result Value Ref Range Status   Specimen Description URINE, RANDOM  Final   Special Requests NONE  Final   Culture   Final    NO GROWTH Performed at [facility], [place] [address] [place], [place] [address]    Report Status 11/19/2017 FINAL  Final  Culture, blood (routine x 2)     Status: None (Preliminary result)   Collection Time: 11/13/2017  4:00 PM  Result Value Ref Range Status   Specimen Description BLOOD RIGHT ANTECUBITAL  Final   Special Requests   Final    BOTTLES DRAWN AEROBIC ONLY Blood Culture results may not be optimal due to an inadequate volume of blood received in culture bottles   Culture   Final    NO GROWTH 3  DAYS Performed at [facility], [place] [address] [place], [place] [address]    Report Status PENDING  Incomplete  Culture, blood (routine x 2)     Status: None (Preliminary result)   Collection Time: 11/04/2017  4:30 PM  Result Value Ref Range Status   Specimen Description BLOOD LEFT HAND  Final   Special Requests   Final    BOTTLES DRAWN AEROBIC ONLY Blood Culture results may not be optimal due to an inadequate volume of blood received in culture bottles   Culture   Final    NO GROWTH 3 DAYS Performed at [facility], [place] [address] [place],  [address]    Report Status PENDING  Incomplete    Coagulation Studies: No results for input(s): LABPROT, INR in the last 72 hours.  Urinalysis: No results for input(s): COLORURINE, LABSPEC, PHURINE, GLUCOSEU, HGBUR, BILIRUBINUR, KETONESUR, PROTEINUR, UROBILINOGEN, NITRITE, LEUKOCYTESUR in the last 72 hours.  Invalid input(s): APPERANCEUR    Imaging: No results found.   Medications:   . sodium chloride    .  sodium bicarbonate (isotonic) infusion in sterile water Stopped (11/21/17 1039)   . feeding supplement (ENSURE ENLIVE)  237 mL Oral BID BM  . heparin  5,000 Units Subcutaneous Q8H  . mouth rinse  15 mL Mouth Rinse BID  . pneumococcal 23 valent vaccine  0.5 mL Intramuscular Tomorrow-1000  . polyethylene glycol  17 g Oral Daily  . senna-docusate  2 tablet Oral BID   sodium chloride, acetaminophen, ondansetron (ZOFRAN) IV, traMADol  Assessment/ Plan:  Diane Martin is a 84 yo F w/ a PMHx notable for HTN on an ARB and HLD who presented to [facility] on 07/17 w/ concern for chronic abdominal pain that acutely worsened associated weakness. She had consumed high amounts of Aleve for several days for this pain. Prior Cr appears to have been 1.6 at [facility] but up to 4.3 today with a K 6.1 down to 4.5 by day HD2. CT Abd demonstrated a large mass w/ omental extension but without hydronephrosis. She was  transferred to [facility] due to decreased Hgb for a higher level of care in an ICU. She is now on a regular floor. She continues to improve and should be stable from a renal standpoint for transfer to [facility] if  her Cr continues to improve today.   Plan:  Acute renal injury 2/2 NSAIDs/ARB use and transient hypotension-Cr. 1.38 on 7/15. Up to 4.3 on 07/18. Her renal function is stable, 2.9-> 3.03. K 3.8. She does appear hypervolemic on exam and will therefore stop IVF. UOP and daily weights have not been recorded x 2 days. She will need Strict I/Os + daily weights. RN aware.   ANEMIA-Hgb 9.7. No signs of acute blood loss. Continue to monitor   HTN/VOL-Remains hypotensive. Holding antihypertensives. She also appears hypervolemic on exam. Stopped IVF.   Hyperkalemia-Resolved. Monitor with daily BMPs  Hyponatremia- stable, Na 130. IVF stopped today. Continue to monitor.   Dysphagia: consider swallow eval as patient noted to be coughing while eating solid foods and having difficulty swallowing.     LOS: 3 11/19/2017  Renal Attending: She is volume overloaded and fluids stopped. Furosemide x 1 Also urinary retention today. Erling Cruz, MD

## 2017-11-22 DIAGNOSIS — N170 Acute kidney failure with tubular necrosis: Secondary | ICD-10-CM

## 2017-11-22 DIAGNOSIS — Z515 Encounter for palliative care: Secondary | ICD-10-CM

## 2017-11-22 LAB — CA 125: CANCER ANTIGEN (CA) 125: 613.7 U/mL — AB (ref 0.0–38.1)

## 2017-11-22 LAB — CEA: CEA: 7.4 ng/mL — ABNORMAL HIGH (ref 0.0–4.7)

## 2017-11-22 LAB — BASIC METABOLIC PANEL
ANION GAP: 17 — AB (ref 5–15)
BUN: 67 mg/dL — ABNORMAL HIGH (ref 8–23)
CO2: 25 mmol/L (ref 22–32)
Calcium: 10.4 mg/dL — ABNORMAL HIGH (ref 8.9–10.3)
Chloride: 91 mmol/L — ABNORMAL LOW (ref 98–111)
Creatinine, Ser: 4.05 mg/dL — ABNORMAL HIGH (ref 0.44–1.00)
GFR calc Af Amer: 11 mL/min — ABNORMAL LOW (ref >60)
GFR, EST NON AFRICAN AMERICAN: 9 mL/min — AB (ref >60)
GLUCOSE: 135 mg/dL — AB (ref 70–99)
Potassium: 3.8 mmol/L (ref 3.5–5.1)
Sodium: 133 mmol/L — ABNORMAL LOW (ref 135–145)

## 2017-11-22 LAB — PROTIME-INR
INR: 1.35
PROTHROMBIN TIME: 16.6 s — AB (ref 11.4–15.2)

## 2017-11-22 LAB — CBC
HCT: 31.4 % — ABNORMAL LOW (ref 36.0–46.0)
Hemoglobin: 10.4 g/dL — ABNORMAL LOW (ref 12.0–15.0)
MCH: 30.7 pg (ref 26.0–34.0)
MCHC: 33.1 g/dL (ref 30.0–36.0)
MCV: 92.6 fL (ref 78.0–100.0)
PLATELETS: 452 10*3/uL — AB (ref 150–400)
RBC: 3.39 MIL/uL — ABNORMAL LOW (ref 3.87–5.11)
RDW: 13.2 % (ref 11.5–15.5)
WBC: 29.6 10*3/uL — AB (ref 4.0–10.5)

## 2017-11-22 LAB — CANCER ANTIGEN 19-9: CA 19-9: 406 U/mL — ABNORMAL HIGH (ref 0–35)

## 2017-11-22 MED ORDER — GLYCOPYRROLATE 0.2 MG/ML IJ SOLN: 0.2000 mg | INTRAMUSCULAR | Status: DC | PRN

## 2017-11-22 MED ORDER — HALOPERIDOL LACTATE 2 MG/ML PO CONC
0.5000 mg | ORAL | Status: DC | PRN
  Filled 2017-11-22: qty 0.3

## 2017-11-22 MED ORDER — HYDROMORPHONE HCL 1 MG/ML IJ SOLN
0.2500 mg | Freq: Four times a day (QID) | INTRAMUSCULAR | Status: DC
  Administered 2017-11-22 – 2017-11-23 (×4): 0.25 mg via INTRAVENOUS
  Filled 2017-11-22 (×4): qty 0.5

## 2017-11-22 MED ORDER — BIOTENE DRY MOUTH MT LIQD: 15.0000 mL | OROMUCOSAL | Status: DC | PRN

## 2017-11-22 MED ORDER — HALOPERIDOL LACTATE 5 MG/ML IJ SOLN
0.5000 mg | INTRAMUSCULAR | Status: DC | PRN
Start: 2017-11-22 — End: 2017-11-23

## 2017-11-22 MED ORDER — HALOPERIDOL 0.5 MG PO TABS
0.5000 mg | ORAL_TABLET | ORAL | Status: DC | PRN
  Filled 2017-11-22: qty 1

## 2017-11-22 MED ORDER — LORAZEPAM 2 MG/ML IJ SOLN
0.2500 mg | Freq: Two times a day (BID) | INTRAMUSCULAR | Status: DC
  Administered 2017-11-22 – 2017-11-23 (×3): 0.25 mg via INTRAVENOUS
  Filled 2017-11-22 (×3): qty 1

## 2017-11-22 MED ORDER — SODIUM CHLORIDE 0.9 % IV SOLN
INTRAVENOUS | Status: DC
  Administered 2017-11-22: 09:00:00 via INTRAVENOUS

## 2017-11-22 MED ORDER — POLYVINYL ALCOHOL 1.4 % OP SOLN
1.0000 [drp] | Freq: Four times a day (QID) | OPHTHALMIC | Status: DC | PRN
  Filled 2017-11-22: qty 15

## 2017-11-22 MED ORDER — MORPHINE SULFATE (PF) 2 MG/ML IV SOLN: 1.0000 mg | INTRAVENOUS | Status: DC | PRN

## 2017-11-22 MED ORDER — GLYCOPYRROLATE 1 MG PO TABS: 1.0000 mg | ORAL_TABLET | ORAL | Status: DC | PRN

## 2017-11-22 MED ORDER — HYDROMORPHONE HCL 1 MG/ML IJ SOLN: 0.5000 mg | INTRAMUSCULAR | Status: DC | PRN

## 2017-11-22 NOTE — Progress Notes (Addendum)
PROGRESS NOTE    Diane Martin  EHM:[id number] DOB: 1931-08-11 DOA: 11/16/2017 PCP: Default, Provider, MD    Brief Narrative:  84 year old with hypertension, hyperlipidemia admitted to hospital in [place] for abdominal pain, constipation, large pelvic mass, developed AKI with a creatinine of 4.3, hyperkalemia. CT abdomen and pelvis without contrast showed a pelvic mass suspicious for gyn malignancy with peritoneal metastasis.  She developed hypotension and admitted to [facility] service and transferred to ICU. She was resuscitated with IV fluids and transferred to [facility] service on 7/21.   Assessment & Plan:   Active Problems:   Acute kidney injury ([place])   Pelvic mass   Shock ([place])   Acute renal failure (ARF) (HCC)   Malnutrition of moderate degree   Hypovolemic shock:  Initially admitted to ICU under PCCM service.  Did not require vasopressors.  On IV fluids for resuscitation.  BP parameters are still borderline.      AKI with metabolic acidosis.  Suspect secondary to a combination of hypovolemia, contrast induced nephropathy , NSAIDS, ARB use.  Creatinine trend from 4.3 to 3.2 >> 2.9> 3>>4. At this point patient's family does not want any aggressive measures. They would like to transition to comfort care. Palliative care consulted.    Heterogenous ovarian mass with peritoneal carcinomatosis:  - ob gyn consulted, Dr Glennon MacLaurance Flatten, ordered tumour markers and recommended CT guided omental biopsy. But family at bedside, does not want any aggressive measure or life prolonging measures. They would like to focus on comfort , . They wanted to me stop the CT guided biopsy at this time.  - pain control with tramadol oral and IV morphine. - senna colace and miralax for constipation.  - palliative care consulted for goals of care, symptom management and transition to comfort care with plans for residential hospice.    Anemia of chronic disease:  Hemoglobin stable around 9.  No signs of acute  blood loss.      Hyperkalemia:  Resolved.    Hyponatremia:  ? Unclear etiology. Sodium at 130, suspect a component of fluid overload, with ascites and pedal edema.    Leukocytosis with thrombocytosis : wbc count at 29.6 Suspect reactive.  No signs of infection.   H/o Hypertension:  bp parameters borderline. Holding home bp medications.    Hyperlipidemia: on pravachol at home.   Urinary retention:  Foley placed, only at bout 200 ml of urine in the last 24 hours.    Ascites: US paracentesis both for therapeutic and diagnostic purposes.    DVT prophylaxis: sq heparin.  Code Status: FULL CODE.  Family Communication: daughters at bedside. Discussed the plan  Disposition Plan: transition to comfort care, palliative care consult.    Consultants:   Ob Gyn- Dr Glennon MacLaurance Flatten.   Nephrology   Palliative care consult  IR  Procedures: none.   Antimicrobials: none.    Subjective: Sleepy, but answering questions appropriately.   Objective: Vitals:   11/22/17 0129 11/22/17 0404 11/22/17 0730 11/22/17 0852  BP: 108/67  (!) 105/48 (!) 93/47  Pulse: 82  85 86  Resp: 16  13 14   Temp:  98.2 F (36.8 C)  98.1 F (36.7 C)  TempSrc:    Axillary  SpO2: (!) 89%  92% 92%  Weight:  84.2 kg (185 lb 10 oz)    Height:        Intake/Output Summary (Last 24 hours) at 11/22/2017 0952 Last data filed at 11/21/2017 1633 Gross per 24 hour  Intake 466.75 ml  Output  200 ml  Net 266.75 ml   Filed Weights   11/19/17 0500 11/21/17 1729 11/22/17 0404  Weight: 79.9 kg (176 lb 2.4 oz) 83.9 kg (184 lb 15.5 oz) 84.2 kg (185 lb 10 oz)    Examination:  General exam: chronically ill looking lady,  On 4 lit of [place] oxygen.  Respiratory system: diminished at bases, no wheezing or rhonchi.  Cardiovascular system: S1 & S2 heard, RRR. No JVD,   Gastrointestinal system: Abdomen is non tender distended , tender in the lower quadrant.  Central nervous system: sleepy but able to answer  simple questions.  Extremities: leg edema 2+ Skin: lower extremities have multiple keratosis / dermatosis papules, non itchy.  Psychiatry: sleepy.     Data Reviewed: I have personally reviewed following labs and imaging studies  CBC: Recent Labs  Lab 11/06/2017 1611 11/19/17 0447 11/20/17 1210 11/22/17 0455  WBC 31.8* 28.9* 33.9* 29.6*  NEUTROABS 28.9*  --   --   --   HGB 10.8* 9.7* 10.7* 10.4*  HCT 32.4* 29.4* 32.0* 31.4*  MCV 93.1 93.0 91.7 92.6  PLT 445* 416* 466* 607*   Basic Metabolic Panel: Recent Labs  Lab 11/08/2017 1611  11/19/17 1641 11/19/17 2043 11/20/17 1210 11/21/17 1045 11/22/17 0455  NA 125*   < > 128* 129* 130* 130* 133*  K 5.1   < > 4.8 4.7 3.8 3.8 3.8  CL 95*   < > 99 100 95* 91* 91*  CO2 15*   < > 16* 15* 20* 23 25  GLUCOSE 97   < > 137* 128* 163* 156* 135*  BUN 47*   < > 44* 46* 50* 57* 67*  CREATININE 4.47*   < > 3.39* 3.20* 2.97* 3.03* 4.05*  CALCIUM 10.5*   < > 10.0 10.0 10.0 10.1 10.4*  MG 1.7  --   --   --   --   --   --   PHOS  --   --   --   --  5.3* 6.0*  --    < > = values in this interval not displayed.   GFR: Estimated Creatinine Clearance: 10.4 mL/min (A) (by C-G formula based on SCr of 4.05 mg/dL (H)). Liver Function Tests: Recent Labs  Lab 11/05/2017 1611 11/20/17 1210 11/21/17 1045  AST 14*  --   --   ALT 10  --   --   ALKPHOS 94  --   --   BILITOT 0.8  --   --   PROT 6.1*  --   --   ALBUMIN 2.6* 2.2* 2.0*   No results for input(s): LIPASE, AMYLASE in the last 168 hours. No results for input(s): AMMONIA in the last 168 hours. Coagulation Profile: Recent Labs  Lab 11/22/17 0455  INR 1.35   Cardiac Enzymes: No results for input(s): CKTOTAL, CKMB, CKMBINDEX, TROPONINI in the last 168 hours. BNP (last 3 results) No results for input(s): PROBNP in the last 8760 hours. HbA1C: No results for input(s): HGBA1C in the last 72 hours. CBG: Recent Labs  Lab 11/19/17 0349 11/19/17 0727 11/19/17 1112 11/19/17 1526  11/19/17 1648  GLUCAP 100* 93 110* 138* 145*   Lipid Profile: No results for input(s): CHOL, HDL, LDLCALC, TRIG, CHOLHDL, LDLDIRECT in the last 72 hours. Thyroid Function Tests: No results for input(s): TSH, T4TOTAL, FREET4, T3FREE, THYROIDAB in the last 72 hours. Anemia Panel: No results for input(s): VITAMINB12, FOLATE, FERRITIN, TIBC, IRON, RETICCTPCT in the last 72 hours. Sepsis Labs: Recent Labs  Lab 11/15/2017 1602 11/29/2017 1611 11/07/2017 1845 11/19/17 0447  PROCALCITON  --  0.85  --  0.99  LATICACIDVEN 2.4*  --  2.4*  --     Recent Results (from the past 240 hour(s))  MRSA PCR Screening     Status: None   Collection Time: 11/30/2017 12:58 PM  Result Value Ref Range Status   MRSA by PCR NEGATIVE NEGATIVE Final    Comment:        The GeneXpert MRSA Assay (FDA approved for NASAL specimens only), is one component of a comprehensive MRSA colonization surveillance program. It is not intended to diagnose MRSA infection nor to guide or monitor treatment for MRSA infections. Performed at [facility], [place] [address] [place], [place] [address]   Culture, Urine     Status: None   Collection Time: 11/16/2017  3:00 PM  Result Value Ref Range Status   Specimen Description URINE, RANDOM  Final   Special Requests NONE  Final   Culture   Final    NO GROWTH Performed at [facility], [place] [address], [place], [place] [address]    Report Status 11/19/2017 FINAL  Final  Culture, blood (routine x 2)     Status: None (Preliminary result)   Collection Time: 11/28/2017  4:00 PM  Result Value Ref Range Status   Specimen Description BLOOD RIGHT ANTECUBITAL  Final   Special Requests   Final    BOTTLES DRAWN AEROBIC ONLY Blood Culture results may not be optimal due to an inadequate volume of blood received in culture bottles   Culture   Final    NO GROWTH 3 DAYS Performed at [facility], [place] [address] [place], [place] [address]    Report Status PENDING  Incomplete   Culture, blood (routine x 2)     Status: None (Preliminary result)   Collection Time: 11/27/2017  4:30 PM  Result Value Ref Range Status   Specimen Description BLOOD LEFT HAND  Final   Special Requests   Final    BOTTLES DRAWN AEROBIC ONLY Blood Culture results may not be optimal due to an inadequate volume of blood received in culture bottles   Culture   Final    NO GROWTH 3 DAYS Performed at [facility], [place] [address] [place], [place] [address]    Report Status PENDING  Incomplete         Radiology Studies: Dg Chest Port 1 View  Result Date: 11/21/2017 CLINICAL DATA:  Worsening dyspnea today. EXAM: PORTABLE CHEST 1 VIEW COMPARISON:  11/19/2017 FINDINGS: The heart size and mediastinal contours are within normal limits. Low lung volumes with bibasilar atelectasis again noted. No evidence of pulmonary consolidation or edema. No evidence of pleural effusion. The visualized skeletal structures are unremarkable. IMPRESSION: Low lung volumes with persistent bibasilar atelectasis. Electronically Signed   By: Earle Gell M.D.   On: 11/21/2017 17:18        Scheduled Meds: . feeding supplement (ENSURE ENLIVE)  237 mL Oral BID BM  . mouth rinse  15 mL Mouth Rinse BID  . pneumococcal 23 valent vaccine  0.5 mL Intramuscular Tomorrow-1000  . polyethylene glycol  17 g Oral Daily  . senna-docusate  2 tablet Oral BID   Continuous Infusions: . sodium chloride       LOS: 4 days    Time spent: 35 minutes.     Hosie Poisson, MD [facility] Pager [phone]   If 7PM-7AM, please contact  night-coverage [email or web address] Password [facility] 11/22/2017, 9:52 AM

## 2017-11-22 NOTE — Progress Notes (Addendum)
[facility] ROUNDING NOTE   Subjective:   Interval History: has complaints of persistent abdominal pain, early satiety and generalized malaise. She denied acute chest pain, nausea, vomiting or weakenss. The patients niece-in-law stated that they will not be going to [facility] now and are awaiting an in house consult.   Objective:  Vital signs in last 24 hours:  Temp:  [97.4 F (36.3 C)-98.4 F (36.9 C)] 98.1 F (36.7 C) (07/22 0852) Pulse Rate:  [74-86] 86 (07/22 0852) Resp:  [13-19] 14 (07/22 0852) BP: (89-108)/(47-67) 93/47 (07/22 0852) SpO2:  [89 %-93 %] 92 % (07/22 0852) Weight:  [184 lb 15.5 oz (83.9 kg)-185 lb 10 oz (84.2 kg)] 185 lb 10 oz (84.2 kg) (07/22 0404)  Weight change:  Filed Weights   11/19/17 0500 11/21/17 1729 11/22/17 0404  Weight: 176 lb 2.4 oz (79.9 kg) 184 lb 15.5 oz (83.9 kg) 185 lb 10 oz (84.2 kg)   Intake/Output: I/O last 3 completed shifts: In: 1686.9 [P.O.:270; I.V.:1416.9] Out: 200 [Urine:200]   Intake/Output this shift:  No intake/output data recorded.  Physical Exam: General: A/O x4, in no acute distress, afebrile, nondiaphoretic CVS- RRR, no murmur ausculted  RS- CTA bilaterally  ABD- BS present, distended, mildly tender.  EXT- no edema  Basic Metabolic Panel: Recent Labs  Lab 12/01/2017 1611  11/19/17 1641 11/19/17 2043 11/20/17 1210 11/21/17 1045 11/22/17 0455  NA 125*   < > 128* 129* 130* 130* 133*  K 5.1   < > 4.8 4.7 3.8 3.8 3.8  CL 95*   < > 99 100 95* 91* 91*  CO2 15*   < > 16* 15* 20* 23 25  GLUCOSE 97   < > 137* 128* 163* 156* 135*  BUN 47*   < > 44* 46* 50* 57* 67*  CREATININE 4.47*   < > 3.39* 3.20* 2.97* 3.03* 4.05*  CALCIUM 10.5*   < > 10.0 10.0 10.0 10.1 10.4*  MG 1.7  --   --   --   --   --   --   PHOS  --   --   --   --  5.3* 6.0*  --    < > = values in this interval not displayed.    Liver Function Tests: Recent Labs  Lab 11/27/2017 1611 11/20/17 1210 11/21/17 1045  AST 14*  --   --   ALT 10  --    --   ALKPHOS 94  --   --   BILITOT 0.8  --   --   PROT 6.1*  --   --   ALBUMIN 2.6* 2.2* 2.0*   No results for input(s): LIPASE, AMYLASE in the last 168 hours. No results for input(s): AMMONIA in the last 168 hours.  CBC: Recent Labs  Lab 11/14/2017 1611 11/19/17 0447 11/20/17 1210 11/22/17 0455  WBC 31.8* 28.9* 33.9* 29.6*  NEUTROABS 28.9*  --   --   --   HGB 10.8* 9.7* 10.7* 10.4*  HCT 32.4* 29.4* 32.0* 31.4*  MCV 93.1 93.0 91.7 92.6  PLT 445* 416* 466* 452*    Cardiac Enzymes: No results for input(s): CKTOTAL, CKMB, CKMBINDEX, TROPONINI in the last 168 hours.  BNP: Invalid input(s): POCBNP  CBG: Recent Labs  Lab 11/19/17 0349 11/19/17 0727 11/19/17 1112 11/19/17 1526 11/19/17 1648  GLUCAP 100* 93 110* 138* 145*    Microbiology: Results for orders placed or performed during the hospital encounter of 11/07/2017  MRSA PCR Screening  Status: None   Collection Time: 11/07/2017 12:58 PM  Result Value Ref Range Status   MRSA by PCR NEGATIVE NEGATIVE Final    Comment:        The GeneXpert MRSA Assay (FDA approved for NASAL specimens only), is one component of a comprehensive MRSA colonization surveillance program. It is not intended to diagnose MRSA infection nor to guide or monitor treatment for MRSA infections. Performed at [facility], [place] [address] [place], [place] [address]   Culture, Urine     Status: None   Collection Time: 11/12/2017  3:00 PM  Result Value Ref Range Status   Specimen Description URINE, RANDOM  Final   Special Requests NONE  Final   Culture   Final    NO GROWTH Performed at [facility], [place] [address], [place], [place] [address]    Report Status 11/19/2017 FINAL  Final  Culture, blood (routine x 2)     Status: None (Preliminary result)   Collection Time: 11/14/2017  4:00 PM  Result Value Ref Range Status   Specimen Description BLOOD RIGHT ANTECUBITAL  Final   Special Requests   Final    BOTTLES DRAWN AEROBIC ONLY  Blood Culture results may not be optimal due to an inadequate volume of blood received in culture bottles   Culture   Final    NO GROWTH 3 DAYS Performed at [facility], [place] [address] [place], [place] [address]    Report Status PENDING  Incomplete  Culture, blood (routine x 2)     Status: None (Preliminary result)   Collection Time: 11/17/2017  4:30 PM  Result Value Ref Range Status   Specimen Description BLOOD LEFT HAND  Final   Special Requests   Final    BOTTLES DRAWN AEROBIC ONLY Blood Culture results may not be optimal due to an inadequate volume of blood received in culture bottles   Culture   Final    NO GROWTH 3 DAYS Performed at [facility], [place] [address] [place],  [address]    Report Status PENDING  Incomplete    Coagulation Studies: Recent Labs    11/22/17 0455  LABPROT 16.6*  INR 1.35    Urinalysis: No results for input(s): COLORURINE, LABSPEC, PHURINE, GLUCOSEU, HGBUR, BILIRUBINUR, KETONESUR, PROTEINUR, UROBILINOGEN, NITRITE, LEUKOCYTESUR in the last 72 hours.  Invalid input(s): APPERANCEUR    Imaging: Dg Chest Port 1 View  Result Date: 11/21/2017 CLINICAL DATA:  Worsening dyspnea today. EXAM: PORTABLE CHEST 1 VIEW COMPARISON:  11/19/2017 FINDINGS: The heart size and mediastinal contours are within normal limits. Low lung volumes with bibasilar atelectasis again noted. No evidence of pulmonary consolidation or edema. No evidence of pleural effusion. The visualized skeletal structures are unremarkable. IMPRESSION: Low lung volumes with persistent bibasilar atelectasis. Electronically Signed   By: Earle Gell M.D.   On: 11/21/2017 17:18     Medications:   . sodium chloride     . feeding supplement (ENSURE ENLIVE)  237 mL Oral BID BM  . mouth rinse  15 mL Mouth Rinse BID  . pneumococcal 23 valent vaccine  0.5 mL Intramuscular Tomorrow-1000  . polyethylene glycol  17 g Oral Daily  . senna-docusate  2 tablet Oral BID   sodium chloride,  acetaminophen, morphine injection, ondansetron (ZOFRAN) IV, traMADol  Assessment/ Plan:  Niger Akhtar is a 84 yo F w/ a PMHx notable for HTN on an ARB and HLD who presented to [facility] on 07/17  w/ concern for chronic abdominal pain that acutely worsened of late with associated weakness. The patient attested to consuming high amounts of Aleve for several days for this pain. Prior Cr appears to have been 1.6 at [facility] but up to 4.3 today with a potassium of 6.1 down to 4.5 by day two. CT Abd demonstrated a large mass w/ omental extension but without hydronephrosis. She was transferred to [facility] due to decreased Hgb for a higher level of care in an ICU. She was transferred from there to the floor as she has stabilized. She continues to improve and should be stable from a renal standpoint for transfer to [facility] if her Cr continues to improve today.   Plan:  Acute renal injury 2/2 NSAIDs/ARB use and transient hypotension-Cr was initially improving and down to 2.97 but steadily increasing now to 4.05 today. Patient given 60mg  Lasix on 07/21 w/o urinary output. As per family they are considering a palliative approach given that an additional procedure would like to be voided. We will sign off.   ANEMIA-Hgb10.40. No signs of acute blood loss. Continue to monitor  HTN/VOL-Remains hypotensive. Holding antihypertensives. She also appears hypervolemic on exam. Stopped IVF.   Hyponatremia- stable, Na 133.     Transitioning to palliative care and hospice  Will sign off today please call with any questions    LOS: 4 Kathi Ludwig, MD Internal Medicine PGY-2

## 2017-11-22 NOTE — Progress Notes (Addendum)
4:35pm-Patient's nieces and spouse request that patient be transferred to [facility] facility with Hospice. They report that her spouse is unable to travel back and forth from here to [place]. CSW made family aware that it would be private pay at the facility and they would be responsible for ambulance transportation cost. CSW also reiterated the fact that the patient may not be comfortable in a transport that far away. Patient's family reports understanding and requests CSW look in to the nursing facility. CSW sent them the referral for review.    3:30pm-CSW received consult regarding hospice placement near patient's home in [place]. Only nearby facility would be [facility] in [place]. CSW sent in referral but unsure if family will be able to pay privately for ambulance transport there or if patient is appropriate to travel that distance (4 hours away). CSW to continue to follow.  Percell Locus Diane Baxley LCSW [phone]

## 2017-11-22 NOTE — Progress Notes (Signed)
Physical Therapy Discharge Patient Details Name: Diane Martin MRN: [id number] DOB: 1931-07-20 Today's Date: 11/22/2017 Time:  -     Patient discharged from PT services secondary to medical decline - will need to re-order PT to resume therapy services.  Please see latest therapy progress note for current level of functioning and progress toward goals.    Progress and discharge plan discussed with patient and/or caregiver: Patient/Caregiver agrees with plan  Spoke with caregivers and RN. Both advise pt is being put on comfort care and family would like to discontinue PT services at this time.    Benjiman Core, PTA Pager [phone] Acute Rehab    Allena Katz 11/22/2017, 9:56 AM

## 2017-11-22 NOTE — Consult Note (Signed)
Consultation Note Date: 11/22/2017   Patient Name: Diane Martin  DOB: 11-02-1931  MRN: [id number]  Age / Sex: 84 y.o., female  PCP: Default, Provider, MD Referring Physician: Hosie Poisson, MD  Reason for Consultation: Establishing goals of care, Hospice Evaluation, Inpatient hospice referral, Non pain symptom management, Pain control and Psychosocial/spiritual support  HPI/Patient Profile: 84 y.o. female named "Diane Martin" with past medical history of hypertension and hyperlipidemia who was recently diagnosed with a large pelvic mass (25x24 cm) and acute renal failure at [facility].  She was admitted on 11/01/2017 as a hospital to hospital transfer with hypotension.   She has been seen by Nephrology and Gyn-Onc.  Her kidney function initially improved but has since declined significantly.  The patient is having difficulty breathing due to compression from the large pelvic mass.  The family has opted against going forward with biopsy at this point and they are requesting comfort measures.  Clinical Assessment and Goals of Care:  I have reviewed medical records including EPIC notes, labs and imaging, received report from the care team, assessed the patient and then met at the bedside along with her husband Diane Martin and her niece Diane Martin  to discuss diagnosis prognosis, [place], EOL wishes, disposition and options.  I introduced Palliative Medicine as specialized medical care for people living with serious illness. It focuses on providing relief from the symptoms and stress of a serious illness. The goal is to improve quality of life for both the patient and the family.  We discussed a brief life review of the patient.  She and Diane Martin have been married for over 27 years.  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  [place].  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  They had a son together who passed away a few years ago.  As far as functional and nutritional status Diane Martin is no longer able to get up and walk due to SOB and pain.  She is able to take a few sips but due to pressure in her abdomen is no longer eating.  She is having significant abdominal and back pain.  She has a small pressure ulcer on her back per her niece Diane Martin.  We discussed her current illness and what it means in terms of prognosis.  Natural disease trajectory and expectations at EOL were discussed.  Specifically Diane Martin's renal function is declining rapidly and her work of breathing is increasing.  Diane Martin and Diane Martin want Diane Martin to be comfortable and not suffer.  Comfort is the family's priority.  Given this situation, I explained to [place] that Diane Martin is at high risk for an acute event that would take her life suddenly but barring such an event she likely has days to possibly weeks.  Hospice and Palliative Care services outpatient were explained and offered.  Due to Diane Martin's symptom management needs she will require inpatient Hospice Care.  The family requests that she be  placed as close to home [facility], [place]) as possible.     I made the family aware that it is possible Diane Martin may decline rapidly and be unstable for transport.  If that happens we anticipate a hospital death.  They understand.   Primary Decision Maker:  NEXT OF KIN Husband Diane Martin and Niece Diane Martin.    SUMMARY OF RECOMMENDATIONS    Will change orders to reflect comfort care.  Discontinuing interventions that do not contribute to comfort.  Low dose scheduled and PRN dilaudid for shortness of breath and pain. Low dose scheduled and PRN ativan for anxiety PRN robinul for secretions. KVO IVF. Comfort feeds  Social work consult placed for [employer] in [place].  However it would not be surprising if the patient passed away prior to discharge.  Code Status/Advance Care  Planning: DNR  Additional Recommendations (Limitations, Scope, Preferences):  Full Comfort Care  Palliative Prophylaxis:   Frequent Pain Assessment  Psycho-social/Spiritual:   Desire for further Chaplaincy support: yes  Prognosis:   Day to perhaps weeks depending on her rate of decline.  Discharge Planning: Anticipated Hospital Death vs Hospice House      Primary Diagnoses: Present on Admission: . Acute renal failure (ARF) ([place])   I have reviewed the medical record, interviewed the patient and family, and examined the patient. The following aspects are pertinent.  Past Medical History:  Diagnosis Date  . ARF (acute renal failure) ([place]) 11/2017  . Hyperlipidemia   . Hyperlipidemia   . Hypertension   . Pelvic mass 11/2017   Social History   Socioeconomic History  . Marital status: Married    Spouse name: Not on file  . Number of children: Not on file  . Years of education: Not on file  . Highest education level: Not on file  Occupational History  . Not on file  Social Needs  . Financial resource strain: Not on file  . Food insecurity:    Worry: Not on file    Inability: Not on file  . Transportation needs:    Medical: Not on file    Non-medical: Not on file  Tobacco Use  . Smoking status: Never Smoker  . Smokeless tobacco: Never Used  Substance and Sexual Activity  . Alcohol use: Not Currently  . Drug use: Never  . Sexual activity: Not Currently  Lifestyle  . Physical activity:    Days per week: Not on file    Minutes per session: Not on file  . Stress: Not on file  Relationships  . Social connections:    Talks on phone: Not on file    Gets together: Not on file    Attends religious service: Not on file    Active member of club or organization: Not on file    Attends meetings of clubs or organizations: Not on file    Relationship status: Not on file  Other Topics Concern  . Not on file  Social History Narrative  . Not on file   History  reviewed. No pertinent family history. Scheduled Meds: . feeding supplement (ENSURE ENLIVE)  237 mL Oral BID BM  .  HYDROmorphone (DILAUDID) injection  0.25 mg Intravenous Q6H  . LORazepam  0.25 mg Intravenous BID  . mouth rinse  15 mL Mouth Rinse BID  . pneumococcal 23 valent vaccine  0.5 mL Intramuscular Tomorrow-1000  . senna-docusate  2 tablet Oral BID   Continuous Infusions: . sodium chloride     PRN Meds:.sodium chloride,  acetaminophen, antiseptic oral rinse, glycopyrrolate **OR** glycopyrrolate **OR** glycopyrrolate, haloperidol **OR** haloperidol **OR** haloperidol lactate, HYDROmorphone (DILAUDID) injection, ondansetron (ZOFRAN) IV, polyvinyl alcohol, traMADol No Known Allergies Review of Systems patient lethargic  Physical Exam  Well developed female, wakes to my touch CV rrr with 3/6 systolic murmur resp increased work of breathing Abdomen upper abdomen distended and firm.  Lower abdomen soft, I did not hear bowel sounds Lower extremities with 3+ pitting edema   Vital Signs: BP (!) 96/48 (BP Location: Left Arm)   Pulse 87   Temp 97.9 F (36.6 C) (Oral)   Resp 17   Ht _0  (1.575 m)   Wt 84.2 kg (185 lb 10 oz)   SpO2 93%   BMI 33.95 kg/m  Pain Scale: 0-10   Pain Score: 0-No pain   SpO2: SpO2: 93 % O2 Device:SpO2: 93 % O2 Flow Rate: .O2 Flow Rate (L/min): 4 L/min  IO: Intake/output summary:   Intake/Output Summary (Last 24 hours) at 11/22/2017 1458 Last data filed at 11/22/2017 1100 Gross per 24 hour  Intake 120 ml  Output 250 ml  Net -130 ml    LBM: Last BM Date: 11/20/17 Baseline Weight: Weight: 79.3 kg (174 lb 13.2 oz) Most recent weight: Weight: 84.2 kg (185 lb 10 oz)     Palliative Assessment/Data: 20%     Time In: 1:30 Time Out: 2:30 Time Total: 60 min. Greater than 50%  of this time was spent counseling and coordinating care related to the above assessment and plan.  Signed by: Florentina Jenny, PA-C Palliative Medicine Pager:  [phone]  Please contact Palliative Medicine Team phone at [phone] for questions and concerns.  For individual provider: See Shea Evans

## 2017-11-22 NOTE — Care Management Important Message (Signed)
Important Message  Patient Details  Name: Diane Martin MRN: [id number] Date of Birth: 04-01-1932   Medicare Important Message Given:  Yes    Diane Martin Montine Circle 11/22/2017, 4:15 PM

## 2017-11-22 NOTE — Progress Notes (Signed)
Pt had Lasix 60mg  at 1826, Foley in place, output 281ml. [place], NP informed. Pt in no respiratory distress, denied pain. Will continue to monitor pt.

## 2017-11-22 NOTE — Progress Notes (Signed)
Pt BP 91/42, SpO2 90% on 4L nasal cannula. Pt seems to gasp for air periodic ly, respirations shallow and accessory muscles used. Denied SOB, dizziness, but c/o feeling tired. Lung sounds clear on auscultation, diminished in lower lobes. Pt alert and oriented x 4. MD notified.  Will continue to monitor pt.

## 2017-11-23 ENCOUNTER — Telehealth: Payer: Self-pay | Admitting: Gynecologic Oncology

## 2017-11-23 DIAGNOSIS — J9601 Acute respiratory failure with hypoxia: Secondary | ICD-10-CM

## 2017-11-23 DIAGNOSIS — C569 Malignant neoplasm of unspecified ovary: Secondary | ICD-10-CM

## 2017-11-23 DIAGNOSIS — R180 Malignant ascites: Secondary | ICD-10-CM

## 2017-11-23 LAB — CULTURE, BLOOD (ROUTINE X 2)
Culture: NO GROWTH
Culture: NO GROWTH

## 2017-11-23 MED ORDER — HYDROMORPHONE BOLUS VIA INFUSION
1.0000 mg | INTRAVENOUS | Status: DC | PRN
Start: 2017-11-23 — End: 2017-11-23
  Administered 2017-11-23: 1 mg via INTRAVENOUS
  Filled 2017-11-23: qty 1

## 2017-11-23 MED ORDER — HYDROMORPHONE BOLUS VIA INFUSION
0.3000 mg | INTRAVENOUS | Status: DC | PRN
  Filled 2017-11-23: qty 1

## 2017-11-23 MED ORDER — SODIUM CHLORIDE 0.9 % IV SOLN
1.0000 mg/h | INTRAVENOUS | Status: DC
  Administered 2017-11-23: 0.5 mg/h via INTRAVENOUS
  Filled 2017-11-23: qty 2.5

## 2017-11-23 MED ORDER — HYDROMORPHONE HCL 1 MG/ML IJ SOLN
0.5000 mg | Freq: Once | INTRAMUSCULAR | Status: AC
  Administered 2017-11-23: 0.5 mg via INTRAVENOUS

## 2017-11-23 MED ORDER — GLYCOPYRROLATE 0.2 MG/ML IJ SOLN
0.3000 mg | Freq: Three times a day (TID) | INTRAMUSCULAR | Status: DC
  Administered 2017-11-23 (×2): 0.3 mg via INTRAVENOUS
  Filled 2017-11-23 (×2): qty 2

## 2017-12-02 NOTE — Progress Notes (Signed)
PROGRESS NOTE    Diane Martin  GDJ:[id number] DOB: 1931/12/13 DOA: 11/04/2017 PCP: Default, Provider, MD    Brief Narrative:  84 year old with hypertension, hyperlipidemia admitted to hospital in [place] for abdominal pain, constipation, large pelvic mass, developed AKI with a creatinine of 4.3, hyperkalemia. CT abdomen and pelvis without contrast showed a pelvic mass suspicious for gyn malignancy with peritoneal metastasis.  She developed hypotension and admitted to [facility] service and transferred to ICU. She was resuscitated with IV fluids and transferred to [facility] service on 7/21.   In view of her worsening clinical pictures, family requested to be transitioned to comfort care.  Today she is somnolent, with shallow breaths, will probably need morphine gtt and anticipate hospital demise.   Assessment & Plan:   Active Problems:   Acute kidney injury ([place])   Pelvic mass   Shock ([place])   Acute renal failure (ARF) (HCC)   Malnutrition of moderate degree   Palliative care encounter   Acute respiratory failure with hypoxia (HCC)   Malignant neoplasm of ovary (HCC)   Malignant ascites   Hypovolemic shock:  Initially admitted to ICU under PCCM service.  Did not require vasopressors. Initially resuscitated aggressively with IV fluids.  Currently on comfort measures.       AKI with metabolic acidosis.  Suspect secondary to a combination of hypovolemia, contrast induced nephropathy , NSAIDS, ARB use.  Creatinine trend from 4.3 to 3.2 >> 2.9> 3>>4. At this point patient's family does not want any aggressive measures. They would like to transition to comfort care. Appreciate palliative care input.    Heterogenous ovarian mass with peritoneal carcinomatosis:  - ob gyn consulted, Dr Glennon MacLaurance Flatten, ordered tumour markers and recommended CT guided omental biopsy. But pt clinically declined, with worsening mental status and creatinine, and family did not want any aggressive measure or life  prolonging measures. They would like to focus on comfort , . - pain control.   - palliative care consulted for goals of care, symptom management and transition to comfort care with plans for residential hospice.    Anemia of chronic disease:  Hemoglobin stable around 9.  No signs of acute blood loss.      Hyperkalemia:  Resolved.    Hyponatremia:  ? Unclear etiology. Sodium at 130, suspect a component of fluid overload, with ascites and pedal edema.    Leukocytosis with thrombocytosis : wbc count at 29.6 Suspect reactive.  No signs of infection.   H/o Hypertension:     Hyperlipidemia:   Urinary retention:  Foley placed, around 50 ml of urine in the last 24 hours.     Ascites:   Rapid decline in the last 48 hours. Anticipate hospital death.    DVT prophylaxis: Comfort care.  Code Status: DNR. Family Communication: niece at bedside Disposition Plan: transition to comfort care,    Consultants:   Ob Gyn- Dr Glennon MacLaurance Flatten.   Nephrology   Palliative care consult  IR  Procedures: none.   Antimicrobials: none.    Subjective: Somnolent.  Objective: Vitals:   11/22/17 2022 12-10-17 0503 2017/12/10 0505 2017/12/10 0821  BP: (!) 93/45 (!) 74/42 (!) 74/48   Pulse: 86 96 88   Resp: 13 14    Temp: 98.1 F (36.7 C) 97.7 F (36.5 C)    TempSrc: Oral Oral    SpO2: (!) 82% (!) 89% (!) 86%   Weight:    81.2 kg (179 lb 0.2 oz)  Height:        Intake/Output  Summary (Last 24 hours) at 12-11-2017 1234 Last data filed at 2017/12/11 0930 Gross per 24 hour  Intake 120 ml  Output -  Net 120 ml   Filed Weights   11/21/17 1729 11/22/17 0404 2017-12-11 0821  Weight: 83.9 kg (184 lb 15.5 oz) 84.2 kg (185 lb 10 oz) 81.2 kg (179 lb 0.2 oz)    Examination:  General exam: chronically ill looking lady,  On 4 lit of [place] oxygen.  Respiratory system: diminished at bases, shallow breathing,  Cardiovascular system: S1 & S2 heard,  Gastrointestinal system: Abdomen  distended bowel sounds diminished.   Central nervous system: somnolent. Extremities: leg edema 2+ Skin: lower extremities have multiple keratosis / dermatosis papules, non itchy.  Psychiatry: cannot be assessed.     Data Reviewed: I have personally reviewed following labs and imaging studies  CBC: Recent Labs  Lab 11/07/2017 1611 11/19/17 0447 11/20/17 1210 11/22/17 0455  WBC 31.8* 28.9* 33.9* 29.6*  NEUTROABS 28.9*  --   --   --   HGB 10.8* 9.7* 10.7* 10.4*  HCT 32.4* 29.4* 32.0* 31.4*  MCV 93.1 93.0 91.7 92.6  PLT 445* 416* 466* 161*   Basic Metabolic Panel: Recent Labs  Lab 11/03/2017 1611  11/19/17 1641 11/19/17 2043 11/20/17 1210 11/21/17 1045 11/22/17 0455  NA 125*   < > 128* 129* 130* 130* 133*  K 5.1   < > 4.8 4.7 3.8 3.8 3.8  CL 95*   < > 99 100 95* 91* 91*  CO2 15*   < > 16* 15* 20* 23 25  GLUCOSE 97   < > 137* 128* 163* 156* 135*  BUN 47*   < > 44* 46* 50* 57* 67*  CREATININE 4.47*   < > 3.39* 3.20* 2.97* 3.03* 4.05*  CALCIUM 10.5*   < > 10.0 10.0 10.0 10.1 10.4*  MG 1.7  --   --   --   --   --   --   PHOS  --   --   --   --  5.3* 6.0*  --    < > = values in this interval not displayed.   GFR: Estimated Creatinine Clearance: 10.2 mL/min (A) (by C-G formula based on SCr of 4.05 mg/dL (H)). Liver Function Tests: Recent Labs  Lab 11/27/2017 1611 11/20/17 1210 11/21/17 1045  AST 14*  --   --   ALT 10  --   --   ALKPHOS 94  --   --   BILITOT 0.8  --   --   PROT 6.1*  --   --   ALBUMIN 2.6* 2.2* 2.0*   No results for input(s): LIPASE, AMYLASE in the last 168 hours. No results for input(s): AMMONIA in the last 168 hours. Coagulation Profile: Recent Labs  Lab 11/22/17 0455  INR 1.35   Cardiac Enzymes: No results for input(s): CKTOTAL, CKMB, CKMBINDEX, TROPONINI in the last 168 hours. BNP (last 3 results) No results for input(s): PROBNP in the last 8760 hours. HbA1C: No results for input(s): HGBA1C in the last 72 hours. CBG: Recent Labs  Lab  11/19/17 0349 11/19/17 0727 11/19/17 1112 11/19/17 1526 11/19/17 1648  GLUCAP 100* 93 110* 138* 145*   Lipid Profile: No results for input(s): CHOL, HDL, LDLCALC, TRIG, CHOLHDL, LDLDIRECT in the last 72 hours. Thyroid Function Tests: No results for input(s): TSH, T4TOTAL, FREET4, T3FREE, THYROIDAB in the last 72 hours. Anemia Panel: No results for input(s): VITAMINB12, FOLATE, FERRITIN, TIBC, IRON, RETICCTPCT in the last  72 hours. Sepsis Labs: Recent Labs  Lab 11/11/2017 1602 11/21/2017 1611 11/06/2017 1845 11/19/17 0447  PROCALCITON  --  0.85  --  0.99  LATICACIDVEN 2.4*  --  2.4*  --     Recent Results (from the past 240 hour(s))  MRSA PCR Screening     Status: None   Collection Time: 11/15/2017 12:58 PM  Result Value Ref Range Status   MRSA by PCR NEGATIVE NEGATIVE Final    Comment:        The GeneXpert MRSA Assay (FDA approved for NASAL specimens only), is one component of a comprehensive MRSA colonization surveillance program. It is not intended to diagnose MRSA infection nor to guide or monitor treatment for MRSA infections. Performed at [facility], [place] [address] [place], [place] [address]   Culture, Urine     Status: None   Collection Time: 11/13/2017  3:00 PM  Result Value Ref Range Status   Specimen Description URINE, RANDOM  Final   Special Requests NONE  Final   Culture   Final    NO GROWTH Performed at [facility], [place] [address], [place], [place] [address]    Report Status 11/19/2017 FINAL  Final  Culture, blood (routine x 2)     Status: None (Preliminary result)   Collection Time: 11/07/2017  4:00 PM  Result Value Ref Range Status   Specimen Description BLOOD RIGHT ANTECUBITAL  Final   Special Requests   Final    BOTTLES DRAWN AEROBIC ONLY Blood Culture results may not be optimal due to an inadequate volume of blood received in culture bottles   Culture   Final    NO GROWTH 4 DAYS Performed at [facility], [place] [address]  [place], [place] [address]    Report Status PENDING  Incomplete  Culture, blood (routine x 2)     Status: None (Preliminary result)   Collection Time: 11/10/2017  4:30 PM  Result Value Ref Range Status   Specimen Description BLOOD LEFT HAND  Final   Special Requests   Final    BOTTLES DRAWN AEROBIC ONLY Blood Culture results may not be optimal due to an inadequate volume of blood received in culture bottles   Culture   Final    NO GROWTH 4 DAYS Performed at [facility], [place] [address], [place], [place] [address]    Report Status PENDING  Incomplete         Radiology Studies: Dg Chest Port 1 View  Result Date: 11/21/2017 CLINICAL DATA:  Worsening dyspnea today. EXAM: PORTABLE CHEST 1 VIEW COMPARISON:  11/19/2017 FINDINGS: The heart size and mediastinal contours are within normal limits. Low lung volumes with bibasilar atelectasis again noted. No evidence of pulmonary consolidation or edema. No evidence of pleural effusion. The visualized skeletal structures are unremarkable. IMPRESSION: Low lung volumes with persistent bibasilar atelectasis. Electronically Signed   By: Earle Gell M.D.   On: 11/21/2017 17:18        Scheduled Meds: . glycopyrrolate  0.3 mg Intravenous TID  . LORazepam  0.25 mg Intravenous BID  . mouth rinse  15 mL Mouth Rinse BID  . pneumococcal 23 valent vaccine  0.5 mL Intramuscular Tomorrow-1000   Continuous Infusions: . sodium chloride    . HYDROmorphone 0.5 mg/hr (December 17, 2017 0937)     LOS: 5 days    Time spent: 35 minutes.     Hosie Poisson, MD [facility] Pager [phone]   If 7PM-7AM, please  contact night-coverage [email or web address] Password [facility] 2017/12/09, 12:34 PM

## 2017-12-02 NOTE — Discharge Summary (Addendum)
Death Summary  Diane Martin ENI:[id number] DOB: [birth year] DOA: 12-15-2017  PCP: Default, Provider, MD  Admit date: December 15, 2017 Date of Death: 12-20-2017 Time of Death:  3:26pm.  Notification: Default, Provider, MD notified of death of 12-25-17   History of present illness:    84 year old with hypertension, hyperlipidemia admitted to hospital in [place] for abdominal pain, constipation, large pelvic mass, developed AKI with a creatinine of 4.3, hyperkalemia. CT abdomen and pelvis without contrast showed a pelvic mass suspicious for gyn malignancy with peritoneal metastasis.  She developed hypotension and admitted to [facility] service and transferred to ICU. She was resuscitated with IV fluids and transferred to [facility] service on 7/21.   In view of her worsening clinical pictures, family requested to be transitioned to comfort care.  We  anticipated hospital demise.     Hypovolemic shock:  Initially admitted to ICU under PCCM service.  Did not require vasopressors. Initially resuscitated aggressively with IV fluids.  Currently on comfort measures. pt passed away in the hospital.    AKI with metabolic acidosis.  Suspect secondary to a combination of hypovolemia, contrast induced nephropathy , NSAIDS, ARB use.  Creatinine trend from 4.3 to 3.2 >> 2.9> 3>>4. At this point patient's family does not want any aggressive measures. They would like to transition to comfort care. Appreciate palliative care input.    Heterogenous ovarian mass with peritoneal carcinomatosis:  - ob gyn consulted, Dr Glennon MacLaurance Flatten, ordered tumour markers and recommended CT guided omental biopsy. But pt clinically declined, with worsening mental status and creatinine, and family did not want any aggressive measure or life prolonging measures. They would like to focus on comfort , . - palliative care consulted for goals of care, symptom management and transition to comfort care with plans for residential hospice.  Pt  declined rapidly and pt passed away around 4 pm.    Anemia of chronic disease:  No signs of acute blood loss.      Hyperkalemia:  Resolved.    Hyponatremia:  ? Unclear etiology. Sodium at 130, suspect a component of fluid overload, with ascites and pedal edema.    Leukocytosis with thrombocytosis : wbc count at 29.6 Suspect reactive.    H/o Hypertension:     Hyperlipidemia:   Urinary retention:  Foley placed,.    Ascites:         Final Diagnoses:  Acute kidney injury (HCC)   Pelvic mass   Shock ([place])   Acute renal failure (ARF) (HCC)   Malnutrition of moderate degree   Palliative care encounter   Acute respiratory failure with hypoxia (HCC)   Malignant neoplasm of ovary (HCC)   Malignant ascites    The results of significant diagnostics from this hospitalization (including imaging, microbiology, ancillary and laboratory) are listed below for reference.    Significant Diagnostic Studies: Ct Abdomen Pelvis Wo Contrast  Result Date: 12-15-17 CLINICAL DATA:  Pelvic mass EXAM: CT ABDOMEN AND PELVIS WITHOUT CONTRAST TECHNIQUE: Multidetector CT imaging of the abdomen and pelvis was performed following the standard protocol without IV contrast. COMPARISON:  None. FINDINGS: Lower chest: There is mild atelectasis in both lower lobes and in the right middle lobe. Moderate-sized type 3 hiatal hernia with contrast medium both in the hiatal hernia and in the distal esophagus suggesting dysmotility or reflux. 2.9 by 1.3 cm region soft tissue density adjacent to the stomach in the chest on image 14/3, possibly from adenopathy or tumor. There is mild nodularity in the lateral breast measuring about 1.7 by 1.4  cm on image 9/3, nonspecific. Coronary atherosclerosis noted. Aortic valve calcification is present. Hepatobiliary: Layering density in the gallbladder potentially from vicarious excretion of contrast or concentrated sludge. No definite biliary  dilatation although the extrahepatic biliary tree is indistinct due to surrounding a densities. Pancreas: Unremarkable Spleen: Unremarkable Adrenals/Urinary Tract: Contrast medium was not administered on today's exam, but there is corticomedullary contrast in the kidneys along contrast in the collecting systems of both kidneys. By report the patient did have a recent contrast enhanced CT exam in this in [place], [place], and the residual renal parenchymal contrast is indicative of acute renal injury and renal dysfunction. Bilateral mild renal atrophy. A Foley catheter is present in the urinary bladder. No current hydronephrosis. Portions the ureters are effaced in the vicinity of the large abdominopelvic mass. Stomach/Bowel: Displacement of bowel by the large abdominopelvic mass. Sigmoid colon diverticulosis without active diverticulitis. Orally administered contrast makes its way through to the rectum. The appendix appears normal and trace over the upper right side of the abdominopelvic mass. Vascular/Lymphatic: Aortoiliac atherosclerotic vascular disease. There scattered nodularity in the mesentery without well-defined individual lymph nodes. Reproductive: Inseparable from the uterus and ovaries, there is a 25.7 by 15.7 by 24.5 cm (volume = 5180 cm^3) heterogeneous mass filling much of the pelvis and extending up into the abdomen well below the level of the umbilicus. The mass is mildly eccentric to the right side in its upper portion but fairly midline in the lower portion and effaces the urinary bladder there are potentially small punctate calcifications along the anterior inferior margin of the mass for example on images 74-75 of series 3. The ovaries and uterus are not separately individually identifiable. Other: Perihepatic and perisplenic ascites along with ascites tracking along the paracolic gutters. Diffuse infiltration of the omentum with reticular and faintly nodular pattern. Similar reticular and  nodular infiltration of the mesentery. Along the right lower abdominal wall on image 81/3 there is a small amount of subcutaneous stranding and gas. Correlate with any recent injection along the lower abdominal wall. Musculoskeletal: Lumbar spondylosis and degenerative disc disease with grade 1 degenerative anterior subluxation at L3-4 and L4-5, and suspected impingement at L3-4 on the right at L4-5. No findings osseous metastatic disease. IMPRESSION: 1. Large pelvic mass obscuring the uterus/ovaries and extending into the upper abdomen, size estimated at 5180 cubic cm, heterogeneous in appearance, with associated ascites and infiltration and nodularity of the omentum and mesentery. 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 Mucinous gastrointestinal tumors can also have peritoneal spread, but the association of the lesion with the pelvic organs favors ovarian origin. The large mass displaces surrounding structures such as the bowel and ureters. No hydronephrosis currently. 2. Persistent nephrogram related to the patient's outside CT scan from [facility]. Persistent contrast medium in the renal parenchyma indicates prominent renal dysfunction. 3. Nodularity in the left lateral breast, nonspecific and possibly simply due to glandular tissue. Correlate with mammographic history. 4. Moderate-sized type 3 hiatal hernia with contrast in the hernia and distal esophagus suggesting dysmotility or reflux. 5. 2.9 by 1.3 cm area of soft tissue density nodularity adjacent to the herniated stomach in the lower chest, concerning for possible tumor extension along the hiatal hernia. 6. Other imaging findings of potential clinical significance: Mild bibasilar atelectasis. Aortic Atherosclerosis (ICD10-I70.0). Coronary atherosclerosis with aortic valve calcification. Sigmoid colon diverticulosis. Lumbar spondylosis and degenerative disc disease with potential impingement at L3-4 and L4-5.  Electronically Signed   By: Cindra Eves.D.  On: 11/28/2017 17:47   Dg Chest Port 1 View  Result Date: 11/21/2017 CLINICAL DATA:  Worsening dyspnea today. EXAM: PORTABLE CHEST 1 VIEW COMPARISON:  11/19/2017 FINDINGS: The heart size and mediastinal contours are within normal limits. Low lung volumes with bibasilar atelectasis again noted. No evidence of pulmonary consolidation or edema. No evidence of pleural effusion. The visualized skeletal structures are unremarkable. IMPRESSION: Low lung volumes with persistent bibasilar atelectasis. Electronically Signed   By: Earle Gell M.D.   On: 11/21/2017 17:18   Dg Chest Portable 1 View  Result Date: 11/19/2017 CLINICAL DATA:  Shock.  History ARF.  Hypertension. EXAM: PORTABLE CHEST 1 VIEW COMPARISON:  No recent prior. FINDINGS: Mediastinum and hilar structures normal. Heart size normal. Low lung volumes with bibasilar atelectasis. No pleural effusion or pneumothorax. No acute bony abnormality. IMPRESSION: Low lung volumes with bibasilar atelectasis. Electronically Signed   By: Marcello Moores  Register   On: 11/19/2017 12:06    Microbiology: No results found for this or any previous visit (from the past 240 hour(s)).   Labs: Basic Metabolic Panel: Recent Labs  Lab 11/22/17 0455  NA 133*  K 3.8  CL 91*  CO2 25  GLUCOSE 135*  BUN 67*  CREATININE 4.05*  CALCIUM 10.4*   Liver Function Tests: No results for input(s): AST, ALT, ALKPHOS, BILITOT, PROT, ALBUMIN in the last 168 hours. No results for input(s): LIPASE, AMYLASE in the last 168 hours. No results for input(s): AMMONIA in the last 168 hours. CBC: Recent Labs  Lab 11/22/17 0455  WBC 29.6*  HGB 10.4*  HCT 31.4*  MCV 92.6  PLT 452*   Cardiac Enzymes: No results for input(s): CKTOTAL, CKMB, CKMBINDEX, TROPONINI in the last 168 hours. D-Dimer No results for input(s): DDIMER in the last 72 hours. BNP: Invalid input(s): POCBNP CBG: No results for input(s): GLUCAP in the last 168  hours. Anemia work up No results for input(s): VITAMINB12, FOLATE, FERRITIN, TIBC, IRON, RETICCTPCT in the last 72 hours. Urinalysis    Component Value Date/Time   COLORURINE AMBER (A) 11/04/2017 1500   APPEARANCEUR CLOUDY (A) 11/25/2017 1500   LABSPEC 1.024 11/17/2017 1500   PHURINE 5.0 11/17/2017 1500   GLUCOSEU NEGATIVE 11/22/2017 1500   HGBUR LARGE (A) 11/16/2017 1500   BILIRUBINUR NEGATIVE 11/30/2017 1500   KETONESUR NEGATIVE 11/17/2017 1500   PROTEINUR 100 (A) 11/06/2017 1500   NITRITE NEGATIVE 11/02/2017 1500   LEUKOCYTESUR MODERATE (A) 11/11/2017 1500   Sepsis Labs Invalid input(s): PROCALCITONIN,  WBC,  LACTICIDVEN     SIGNED:  Hosie Poisson, MD  [facility] 11/28/2017, 10:44 PM Pager   If 7PM-7AM, please contact night-coverage [email or web address] Password [email or web address]

## 2017-12-02 NOTE — Consult Note (Signed)
Reason for Consult: Newly diagnosed pelvic mass Referring Physician: Esau Grew, MD  Assessment/Plan: Newly diagnosed pelvic mass worrisome for metastatic ovarian cancer (at least stage IIIC).  Given that all tumor markers were elevated, and these are nonspecific, a GI or pancreatic primary is also a possibility. The patient was not able to participate in discussion as she was somnolent today. I called and left a message to call me back with her husband, [name].  Given [name]'s extremely poor performance status (ECOG 4) she is not a candidate for intervention (combined chemotherapy and surgery are the treatment options for this disease).  Given that intervention is not possible, it is reasonable to forgo biopsy of the mass.  I agree with the plan for aggressive palliation of symptoms, hospice care and transfer to closer to home. Anticipated life expectancy is less than 1 month based on my assessment. I would be happy to answer any further questions about this patient's care  [phone])  HPI:    Diane Martin is an 84 y.o. female P1 with admitted to a hospital in [place] for abdominal pain, constipation, large pelvic mass.  She was in ARF.  She had a CT abdomen which showed 23 cm mass concerning for malignancy, no evidence of hydronephrosis per report.  Developed hypotension requiring transferred to the ICU.  She was subsequently [facility] to [facility] for [facility] evaluation.  Repeat imaging showed a large pelvic mass with omental cake and small volume ascites.  A CXR was negative.  The patient endorses several months of abdominal pain, anorexia, nausea, rare vomiting, early satiety and pressure with urination.  Her abdomen has felt " firm".  She reports weight loss of 15 lbs over 6 months.   No family h/o cancer; her mother was diagnoses with a pelvic "tumor".  No prior colonoscopy.   HPI:   Past Medical History:  Diagnosis Date  . ARF (acute renal failure) ([place]) 11/2017   . Hyperlipidemia   . Hyperlipidemia   . Hypertension   . Pelvic mass 11/2017    Past Surgical History:  Procedure Laterality Date  . NO PAST SURGERIES      POB/GYN hx:  NSVD x 1; remote h/o gynecologic care  History reviewed. No pertinent family history.  Social History:  reports that she has never smoked. She has never used smokeless tobacco. She reports that she drank alcohol. She reports that she does not use drugs.  Allergies: No Known Allergies  Medications: I have reviewed the patient's current medications.  Results for orders placed or performed during the hospital encounter of 11/12/2017 (from the past 48 hour(s))  Renal function panel     Status: Abnormal   Collection Time: 11/21/17 10:45 AM  Result Value Ref Range   Sodium 130 (L) 135 - 145 mmol/L   Potassium 3.8 3.5 - 5.1 mmol/L   Chloride 91 (L) 98 - 111 mmol/L    Comment: Please note change in reference range.   CO2 23 22 - 32 mmol/L   Glucose, Bld 156 (H) 70 - 99 mg/dL    Comment: Please note change in reference range.   BUN 57 (H) 8 - 23 mg/dL    Comment: Please note change in reference range.   Creatinine, Ser 3.03 (H) 0.44 - 1.00 mg/dL   Calcium 10.1 8.9 - 10.3 mg/dL   Phosphorus 6.0 (H) 2.5 - 4.6 mg/dL   Albumin 2.0 (L) 3.5 - 5.0 g/dL   GFR calc non Af Wyvonnia Lora  13 (L) >60 mL/min   GFR calc Af Amer 15 (L) >60 mL/min    Comment: (NOTE) The eGFR has been calculated using the CKD EPI equation. This calculation has not been validated in all clinical situations. eGFR's persistently <60 mL/min signify possible Chronic Kidney Disease.    Anion gap 16 (H) 5 - 15    Comment: Performed at [facility], [place] [address], [place], [place] [address]  Protime-INR     Status: Abnormal   Collection Time: 11/22/17  4:55 AM  Result Value Ref Range   Prothrombin Time 16.6 (H) 11.4 - 15.2 seconds   INR 1.35     Comment: Performed at [place] [address] [place], [place] [address]  CBC     Status:  Abnormal   Collection Time: 11/22/17  4:55 AM  Result Value Ref Range   WBC 29.6 (H) 4.0 - 10.5 K/uL    Comment: REPEATED TO VERIFY   RBC 3.39 (L) 3.87 - 5.11 MIL/uL   Hemoglobin 10.4 (L) 12.0 - 15.0 g/dL   HCT 31.4 (L) 36.0 - 46.0 %   MCV 92.6 78.0 - 100.0 fL   MCH 30.7 26.0 - 34.0 pg   MCHC 33.1 30.0 - 36.0 g/dL   RDW 13.2 11.5 - 15.5 %   Platelets 452 (H) 150 - 400 K/uL    Comment: Performed at [facility], [place] [address], [place], [place] [address]  Basic metabolic panel     Status: Abnormal   Collection Time: 11/22/17  4:55 AM  Result Value Ref Range   Sodium 133 (L) 135 - 145 mmol/L   Potassium 3.8 3.5 - 5.1 mmol/L   Chloride 91 (L) 98 - 111 mmol/L    Comment: Please note change in reference range.   CO2 25 22 - 32 mmol/L   Glucose, Bld 135 (H) 70 - 99 mg/dL    Comment: Please note change in reference range.   BUN 67 (H) 8 - 23 mg/dL    Comment: Please note change in reference range.   Creatinine, Ser 4.05 (H) 0.44 - 1.00 mg/dL   Calcium 10.4 (H) 8.9 - 10.3 mg/dL   GFR calc non Af Amer 9 (L) >60 mL/min   GFR calc Af Amer 11 (L) >60 mL/min    Comment: (NOTE) The eGFR has been calculated using the CKD EPI equation. This calculation has not been validated in all clinical situations. eGFR's persistently <60 mL/min signify possible Chronic Kidney Disease.    Anion gap 17 (H) 5 - 15    Comment: Performed at [facility], [place] [address], [place], [place] [address]    Dg Chest Port 1 View  Result Date: 11/21/2017 CLINICAL DATA:  Worsening dyspnea today. EXAM: PORTABLE CHEST 1 VIEW COMPARISON:  11/19/2017 FINDINGS: The heart size and mediastinal contours are within normal limits. Low lung volumes with bibasilar atelectasis again noted. No evidence of pulmonary consolidation or edema. No evidence of pleural effusion. The visualized skeletal structures are unremarkable. IMPRESSION: Low lung volumes with persistent bibasilar atelectasis. Electronically Signed   By:  Earle Gell M.D.   On: 11/21/2017 17:18   CT abd/pelvis : Large pelvic mass obscuring the uterus/ovaries and extending into the upper abdomen, size estimated at 5180 cubic cm, heterogeneous in appearance, with associated ascites and infiltration and nodularity of the omentum and mesentery. 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 Mucinous gastrointestinal tumors can also have peritoneal spread, but the  association of the lesion with the pelvic organs favors ovarian origin. The large mass displaces surrounding structures such as the bowel and ureters. No hydronephrosis currently. Persistent nephrogram related to the patient's outside CT scan from [facility]. Persistent contrast medium in the renal parenchyma indicates prominent renal dysfunction. Nodularity in the left lateral breast, nonspecific and possibly simply due to glandular tissue. Correlate with mammographic history. Moderate-sized type 3 hiatal hernia with contrast in the hernia and distal esophagus suggesting dysmotility or reflux. 2.9 by 1.3 cm area of soft tissue density nodularity adjacent to the herniated stomach in the lower chest, concerning for possible tumor extension along the hiatal hernia. Other imaging findings of potential clinical significance: Mild bibasilar atelectasis. Aortic Atherosclerosis (ICD10-I70.0). Coronary atherosclerosis with aortic valve calcification. Sigmoid colon diverticulosis. Lumbar spondylosis and degenerative disc disease with potential impingement at L3-4 and L4-5  CA 125 on 11/21/17: elevated at 613 CEA on 11/21/17: elevated at 7.4  CA 19-9 on 11/21/17: elevated to 406.   Review of Systems  Constitutional: Positive for weight loss. Negative for chills.  Gastrointestinal: Positive for abdominal pain, constipation and nausea.   Blood pressure (!) 74/48, pulse 88, temperature 97.7 F (36.5 C), temperature source Oral, resp. rate 14, height 5' 2"  (1.575  m), weight 179 lb 0.2 oz (81.2 kg), SpO2 (!) 86 %. Physical Exam  Constitutional: She appears distressed.  Unrousable, rattled breathing of increased work  HENT:  Head: Normocephalic and atraumatic.  Neck: Neck supple.  Respiratory: She is in respiratory distress.  GI: She exhibits distension and mass. There is no tenderness. There is no rebound.  Neurological:  unrousable     Thereasa Solo 11/25/17, 9:14 AM

## 2017-12-02 NOTE — Telephone Encounter (Signed)
Left message to call our office with questions about my recommendations for his wife.

## 2017-12-02 NOTE — Progress Notes (Signed)
Patient goes by name "Diane Martin" Husband and niece with her.  Labored breathing and not responsive.  Visited with family and had prayer for peace of mind and heart, for staff as they provide wonderful care and for the family-husband particularly  As they have been married over 60 years and been through a lot together.  They lost their only child when she was 62 and it was very hard for them.  Conard Novak, Chaplain   01-Dec-2017 1100  Clinical Encounter Type  Visited With Patient and family together  Visit Type Initial;Spiritual support;Critical Care  Referral From Physician  Consult/Referral To Chaplain  Spiritual Encounters  Spiritual Needs Prayer  Stress Factors  Patient Stress Factors  (near death)  Family Stress Factors Exhausted;Loss;Other (Comment) (preparing for death)

## 2017-12-02 NOTE — Progress Notes (Signed)
35 mL of Dilaudid continuous drip wasted with Junie Panning, RN.

## 2017-12-02 NOTE — Progress Notes (Addendum)
Daily Progress Note   Patient Name: Diane Martin       Date: 2017-11-27 DOB: [birth year]  Age: 84 y.o. MRN#: [id number] Attending Physician: Hosie Poisson, MD Primary Care Physician: Default, Provider, MD Admit Date: 11/04/2017  Reason for Consultation/Follow-up: Establishing goals of care, Non pain symptom management, Pain control, Psychosocial/spiritual support and Terminal Care  Subjective: Patient is minimally responsive.  She has declined overnight.  At this point I would not support an ambulance ride to [place].  I will speak with her family.    I believe she is actively dying.  Patient's husband and niece arrive back at the room.  They are in agreement that she has changed overnight and are very appreciative of the excellent nursing care.    They are in agreement with not transferring the patient.  Hospital death anticipated.   Assessment: Patient is actively dying.   Patient Profile/HPI:  84 y.o. female named "Diane Martin" with past medical history of hypertension and hyperlipidemia who was recently diagnosed with a large pelvic mass (25x24 cm) and acute renal failure at [facility].  She was admitted on 11/22/2017 as a hospital to hospital transfer with hypotension.   She has been seen by Nephrology and Gyn-Onc.  Her kidney function initially improved but has since declined significantly.  The patient is having difficulty breathing due to compression from the large pelvic mass.  The family has opted against going forward with biopsy at this point and they are requesting comfort measures.   Length of Stay: 5  Current Medications: Scheduled Meds:  . feeding supplement (ENSURE ENLIVE)  237 mL Oral BID BM  .  HYDROmorphone (DILAUDID) injection  0.25 mg Intravenous Q6H  . LORazepam  0.25  mg Intravenous BID  . mouth rinse  15 mL Mouth Rinse BID  . pneumococcal 23 valent vaccine  0.5 mL Intramuscular Tomorrow-1000  . senna-docusate  2 tablet Oral BID    Continuous Infusions: . sodium chloride      PRN Meds: sodium chloride, acetaminophen, antiseptic oral rinse, glycopyrrolate **OR** glycopyrrolate **OR** glycopyrrolate, haloperidol **OR** haloperidol **OR** haloperidol lactate, HYDROmorphone (DILAUDID) injection, ondansetron (ZOFRAN) IV, polyvinyl alcohol, traMADol  Physical Exam        Well developed female, eyes open but not seeing.  Does not speak CV rrr with systolic murmur resp increased work of  breathing on 3.5L Abdomen distended.  No bowel sounds  Vital Signs: BP (!) 74/48   Pulse 88   Temp 97.7 F (36.5 C) (Oral)   Resp 14   Ht 5\' 2"  (1.575 m)   Wt 81.2 kg (179 lb 0.2 oz)   SpO2 (!) 86%   BMI 32.74 kg/m  SpO2: SpO2: (!) 86 % O2 Device: O2 Device: Nasal Cannula O2 Flow Rate: O2 Flow Rate (L/min): 3.5 L/min  Intake/output summary:   Intake/Output Summary (Last 24 hours) at 11/27/2017 9528 Last data filed at 11/22/2017 1330 Gross per 24 hour  Intake 240 ml  Output 50 ml  Net 190 ml   LBM: Last BM Date: 11/20/17 Baseline Weight: Weight: 79.3 kg (174 lb 13.2 oz) Most recent weight: Weight: 81.2 kg (179 lb 0.2 oz)       Palliative Assessment/Data:  20%      Patient Active Problem List   Diagnosis Date Noted  . Palliative care encounter   . Malnutrition of moderate degree 11/20/2017  . Acute kidney injury ([place]) 11/30/2017  . Pelvic mass 11/10/2017  . Shock ([place]) 11/28/2017  . Acute renal failure (ARF) ([place]) 11/13/2017    Palliative Care Plan    Recommendations/Plan:  Will start low dose continuous infusion of medication for pain and dyspnea.  Will discuss with family.  Goals of Care and Additional Recommendations:  Limitations on Scope of Treatment: Full Comfort Care  Code Status:  DNR  Prognosis:   Hours - Days due to  large pelvic mass (likely ovarian cancer) affecting her respiratory status and renal failure  Discharge Planning:  Anticipated Hospital Death  Care plan was discussed with bedside RN  Thank you for allowing the Palliative Medicine Team to assist in the care of this patient.  Total time spent:  35 min.     Greater than 50%  of this time was spent counseling and coordinating care related to the above assessment and plan.  Florentina Jenny, PA-C Palliative Medicine  Please contact Palliative MedicineTeam phone at [phone] for questions and concerns between 7 am - 7 pm.   Please see AMION for individual provider pager numbers.

## 2017-12-02 DEATH — deceased

## 2018-11-16 IMAGING — CT CT ABD-PELV W/O CM
2 of 4 series · 14 of 46 positions shown, 16 images · non-contrast
Comparison: None.

CLINICAL DATA: Pelvic mass

EXAM:
CT ABDOMEN AND PELVIS WITHOUT CONTRAST
TECHNIQUE: Multidetector CT imaging of the abdomen and pelvis was performed
following the standard protocol without IV contrast.

[Series 3: abd/ pelvis 5.0 i30f 2 · axial · 0.87mm/px · z∈[-882,-442]mm · 11 of 102 slices shown, 13 images]
[im 9/102  soft-tissue]
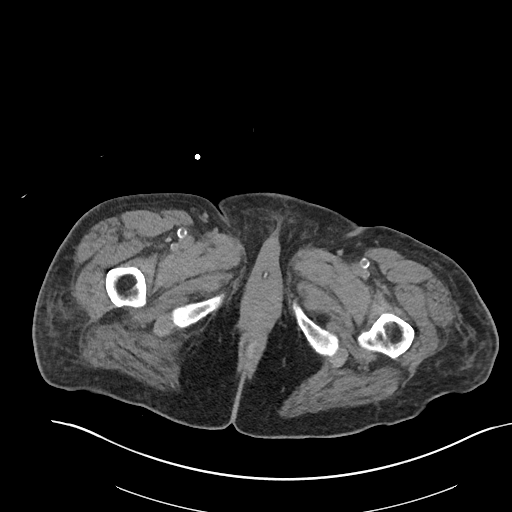
[im 9/102  bone]
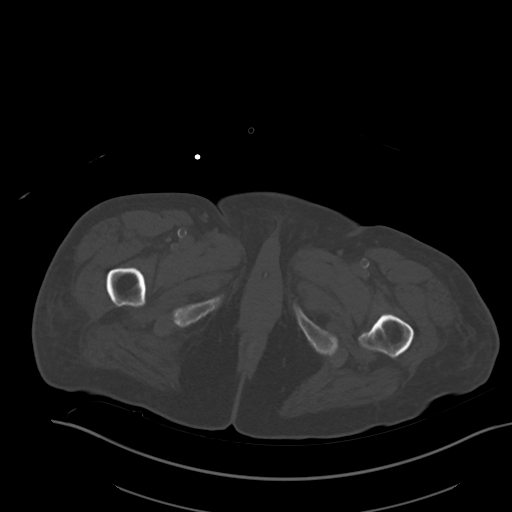
[im 18/102  soft-tissue]
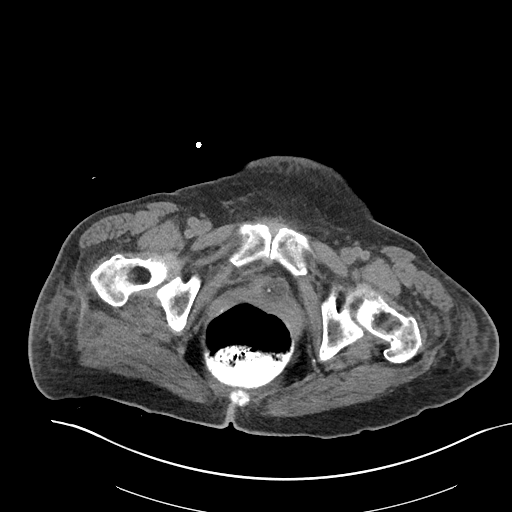
[im 27/102  soft-tissue]
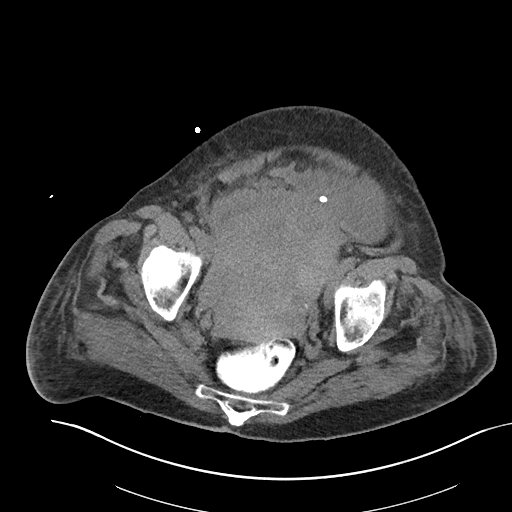
[im 36/102  soft-tissue]
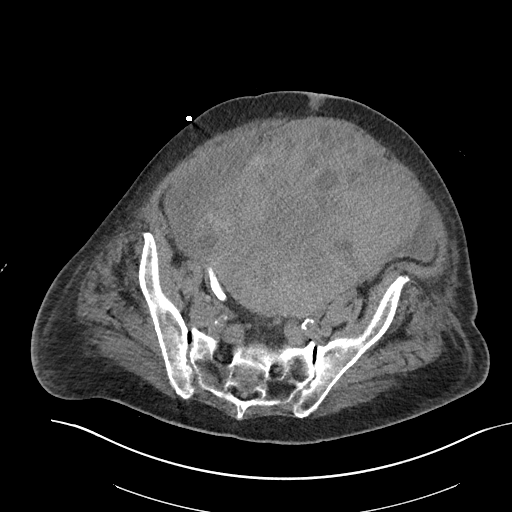
[im 44/102  soft-tissue]
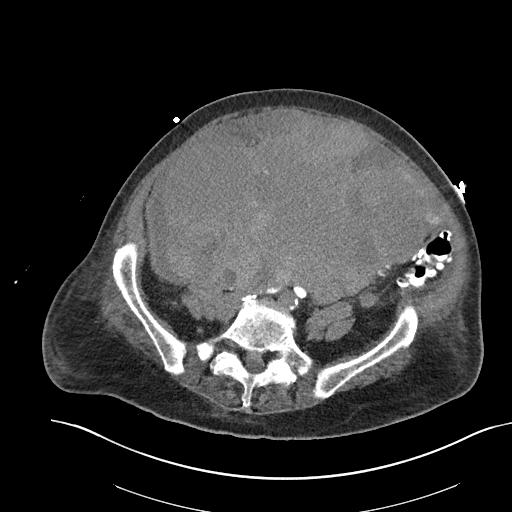
[im 53/102  soft-tissue]
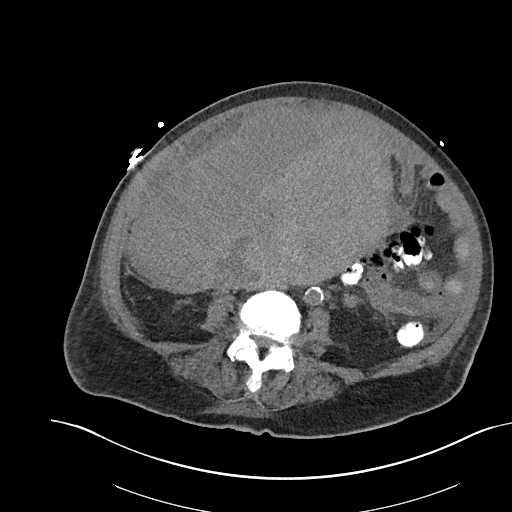
[im 62/102  soft-tissue]
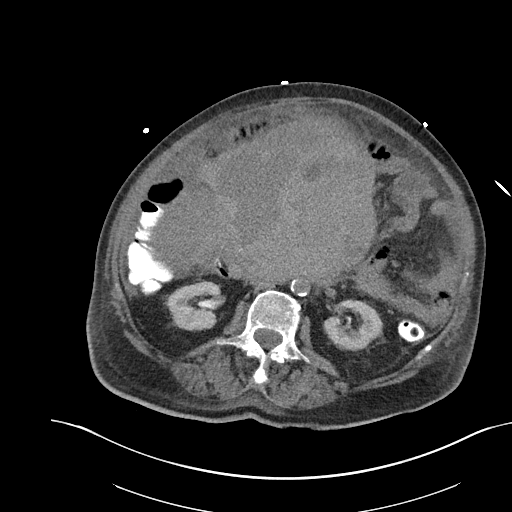
[im 71/102  soft-tissue]
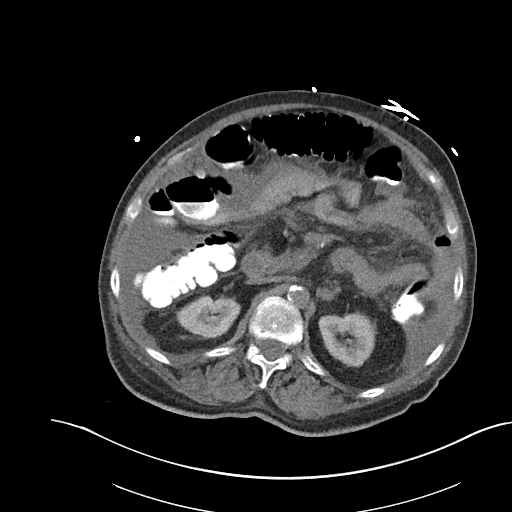
[im 80/102  soft-tissue]
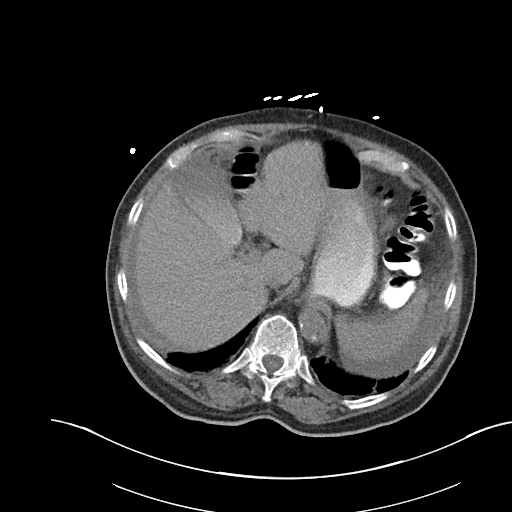
[im 80/102  bone]
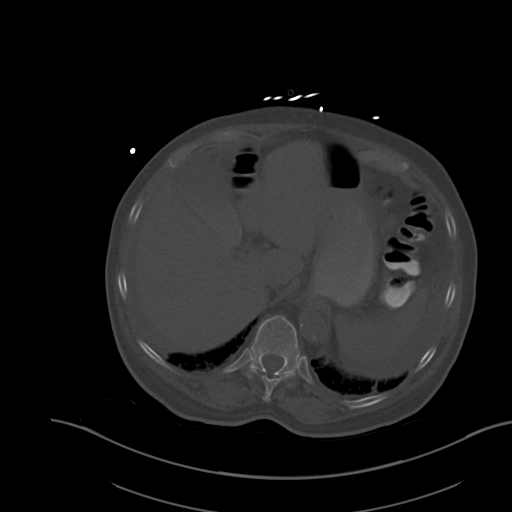
[im 88/102  soft-tissue]
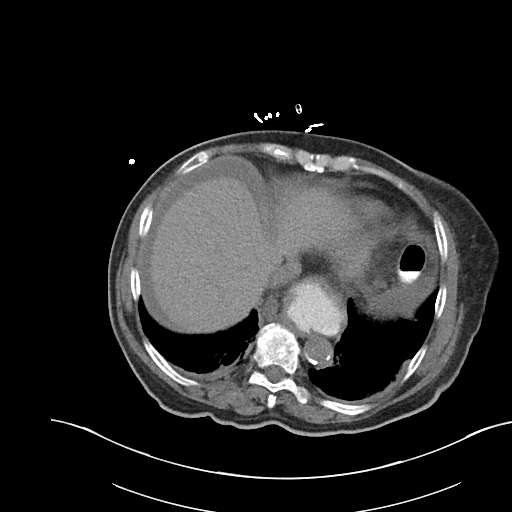
[im 97/102  soft-tissue]
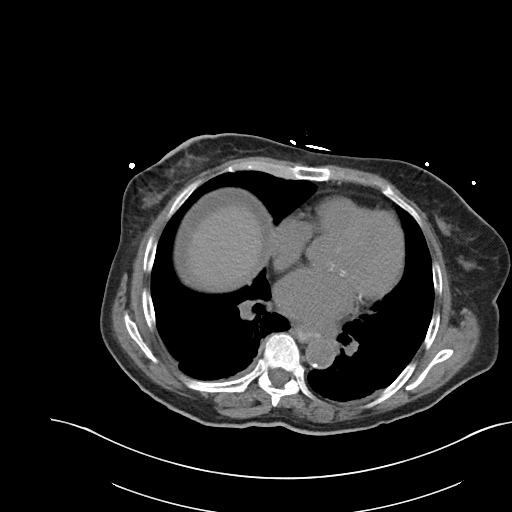

[Series 7: cor st · coronal · 0.85mm/px · 3 of 113 slices shown]
[im 38/113  soft-tissue]
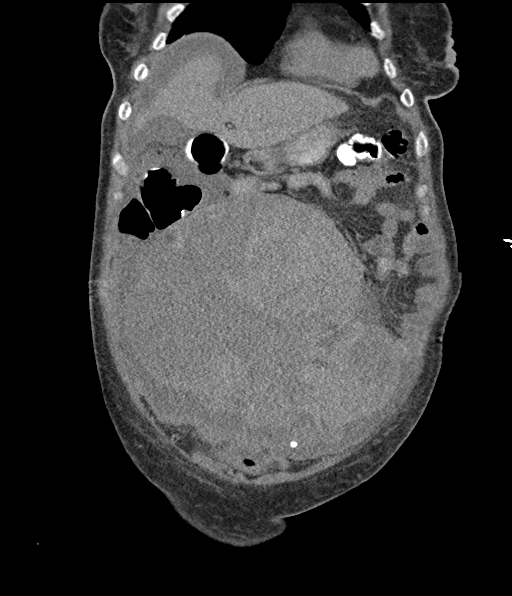
[im 50/113  soft-tissue]
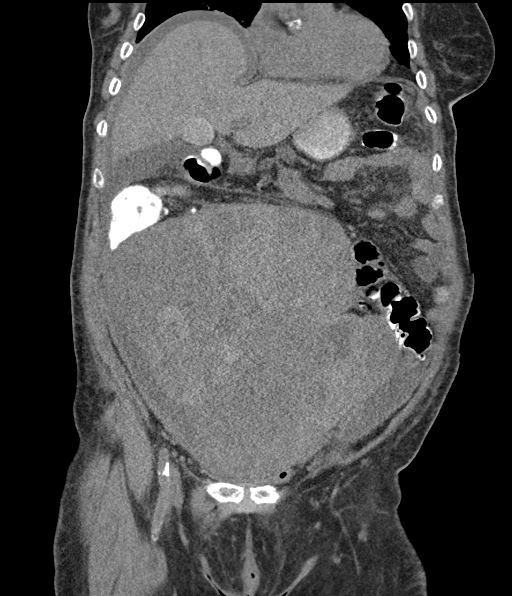
[im 63/113  soft-tissue]
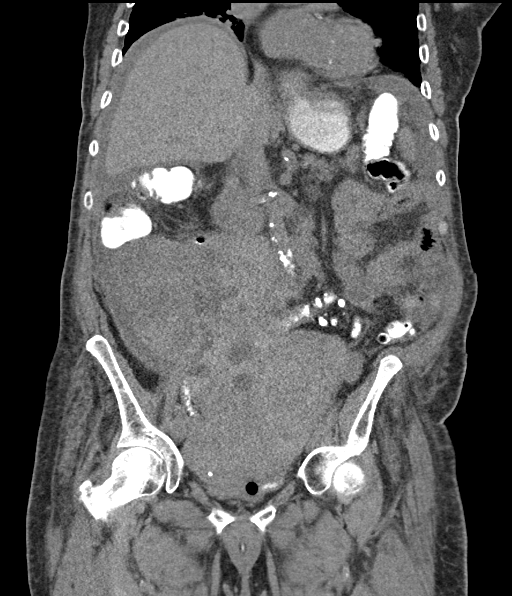

[14 of 46 positions shown; findings below may reference images not displayed]

FINDINGS: Lower chest: There is mild atelectasis in both lower lobes and in
the right middle lobe. Moderate-sized type 3 hiatal hernia with
contrast medium both in the hiatal hernia and in the distal
esophagus suggesting dysmotility or reflux. 2.9 by 1.3 cm region
soft tissue density adjacent to the stomach in the chest on image
[DATE], possibly from adenopathy or tumor.

There is mild nodularity in the lateral breast measuring about
by 1.4 cm on image [DATE], nonspecific.

Coronary atherosclerosis noted. Aortic valve calcification is
present.

Hepatobiliary: Layering density in the gallbladder potentially from
vicarious excretion of contrast or concentrated sludge. No definite
biliary dilatation although the extrahepatic biliary tree is
indistinct due to surrounding a densities.

Pancreas: Unremarkable

Spleen: Unremarkable

Adrenals/Urinary Tract: Contrast medium was not administered on
today's exam, but there is corticomedullary contrast in the kidneys
along contrast in the collecting systems of both kidneys. By report
the patient did have a recent contrast enhanced CT exam in this in
[place] [place] [HOSPITAL], and the residual renal parenchymal contrast
is indicative of acute renal injury and renal dysfunction.

Bilateral mild renal atrophy. A Foley catheter is present in the
urinary bladder. No current hydronephrosis. Portions the ureters are
effaced in the vicinity of the large abdominopelvic mass.

Stomach/Bowel: Displacement of bowel by the large abdominopelvic
mass. Sigmoid colon diverticulosis without active diverticulitis.
Orally administered contrast makes its way through to the rectum.
The appendix appears normal and trace over the upper right side of
the abdominopelvic mass.

Vascular/Lymphatic: Aortoiliac atherosclerotic vascular disease.
There scattered nodularity in the mesentery without well-defined
individual lymph nodes.

Reproductive: Inseparable from the uterus and ovaries, there is a
25.7 by 15.7 by 24.5 cm (volume = 4436 cm^3) heterogeneous mass
filling much of the pelvis and extending up into the abdomen well
below the level of the umbilicus. The mass is mildly eccentric to
the right side in its upper portion but fairly midline in the lower
portion and effaces the urinary bladder there are potentially small
punctate calcifications along the anterior inferior margin of the
mass for example on images 74-75 of series 3. The ovaries and uterus
are not separately individually identifiable.

Other: Perihepatic and perisplenic ascites along with ascites
tracking along the paracolic gutters. Diffuse infiltration of the
omentum with reticular and faintly nodular pattern. Similar
reticular and nodular infiltration of the mesentery.

Along the right lower abdominal wall on image 81/3 there is a small
amount of subcutaneous stranding and gas. Correlate with any recent
injection along the lower abdominal wall.

Musculoskeletal: Lumbar spondylosis and degenerative disc disease
with grade 1 degenerative anterior subluxation at L3-4 and L4-5, and
suspected impingement at L3-4 on the right at L4-5. No findings
osseous metastatic disease.
IMPRESSION: 1. Large pelvic mass obscuring the uterus/ovaries and extending into
the upper abdomen, size estimated at 4436 cubic cm, heterogeneous in
appearance, with associated ascites and infiltration and nodularity
of the omentum and mesentery. Ovarian carcinoma with peritoneal
carcinomatosis is most likely. Mucinous gastrointestinal tumors can
also have peritoneal spread, but the association of the lesion with
the pelvic organs favors ovarian origin. The large mass displaces
surrounding structures such as the bowel and ureters. No
hydronephrosis currently.
2. Persistent nephrogram related to the patient's outside CT scan
from [facility] [HOSPITAL]. Persistent contrast medium in the renal
parenchyma indicates prominent renal dysfunction.
3. Nodularity in the left lateral breast, nonspecific and possibly
simply due to glandular tissue. Correlate with mammographic history.
4. Moderate-sized type 3 hiatal hernia with contrast in the hernia
and distal esophagus suggesting dysmotility or reflux.
5. 2.9 by 1.3 cm area of soft tissue density nodularity adjacent to
the herniated stomach in the lower chest, concerning for possible
tumor extension along the hiatal hernia.
6. Other imaging findings of potential clinical significance: Mild
bibasilar atelectasis. Aortic Atherosclerosis ([id number]).
Coronary atherosclerosis with aortic valve calcification. Sigmoid
colon diverticulosis. Lumbar spondylosis and degenerative disc
disease with potential impingement at L3-4 and L4-5.

## 2018-11-17 IMAGING — DX DG CHEST 1V PORT
1 series · 1 of 1 positions shown · non-contrast
Comparison: No recent prior.

CLINICAL DATA: Shock.  History ARF.  Hypertension.

EXAM:
PORTABLE CHEST 1 VIEW

[chest ap]
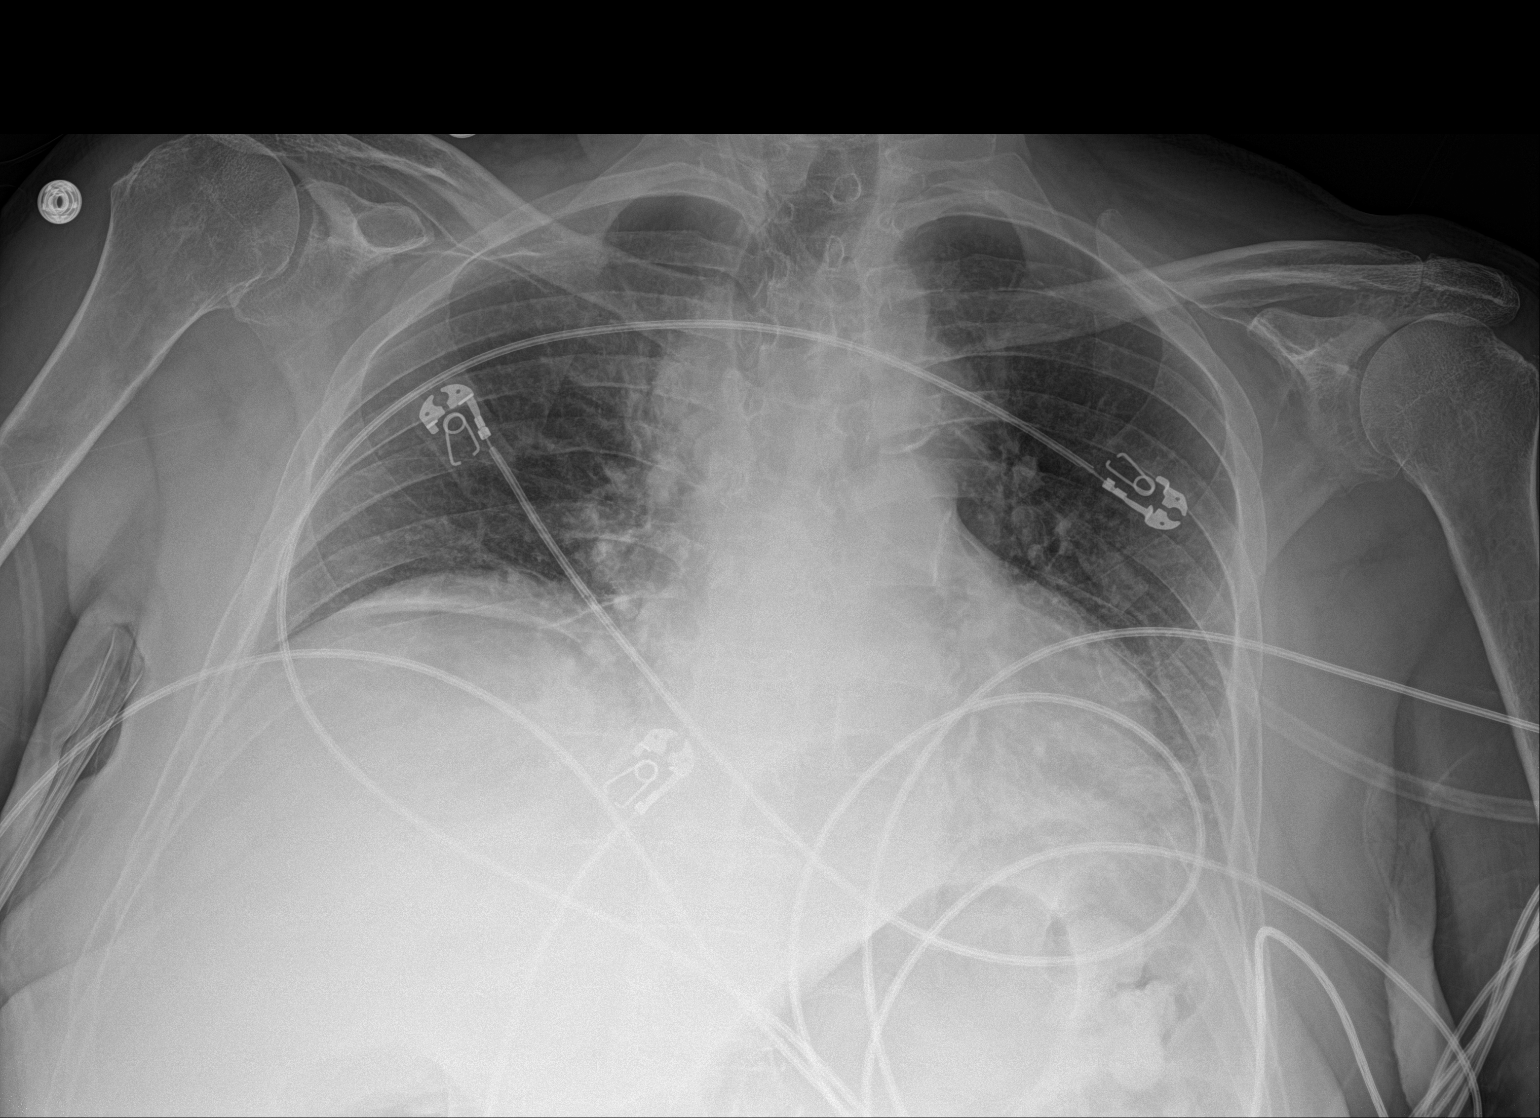

[1 of 1 positions shown; findings below may reference images not displayed]

FINDINGS: Mediastinum and hilar structures normal. Heart size normal. Low lung
volumes with bibasilar atelectasis. No pleural effusion or
pneumothorax. No acute bony abnormality.
IMPRESSION: Low lung volumes with bibasilar atelectasis.
